# Patient Record
Sex: Male | Born: 1935 | Race: White | Hispanic: No | Smoking: Former smoker
Health system: Southern US, Community
[De-identification: ages and names within clinical notes are randomized; demographics above are authoritative.]

## PROBLEM LIST (undated history)

## (undated) DIAGNOSIS — R079 Chest pain, unspecified: Secondary | ICD-10-CM

## (undated) DIAGNOSIS — G252 Other specified forms of tremor: Secondary | ICD-10-CM

## (undated) DIAGNOSIS — F419 Anxiety disorder, unspecified: Secondary | ICD-10-CM

## (undated) DIAGNOSIS — L719 Rosacea, unspecified: Secondary | ICD-10-CM

## (undated) DIAGNOSIS — H409 Unspecified glaucoma: Secondary | ICD-10-CM

## (undated) DIAGNOSIS — Z531 Procedure and treatment not carried out because of patient's decision for reasons of belief and group pressure: Secondary | ICD-10-CM

## (undated) DIAGNOSIS — N200 Calculus of kidney: Secondary | ICD-10-CM

## (undated) DIAGNOSIS — K449 Diaphragmatic hernia without obstruction or gangrene: Secondary | ICD-10-CM

## (undated) DIAGNOSIS — E7800 Pure hypercholesterolemia, unspecified: Secondary | ICD-10-CM

## (undated) DIAGNOSIS — N400 Enlarged prostate without lower urinary tract symptoms: Secondary | ICD-10-CM

## (undated) DIAGNOSIS — R259 Unspecified abnormal involuntary movements: Secondary | ICD-10-CM

## (undated) DIAGNOSIS — H269 Unspecified cataract: Secondary | ICD-10-CM

## (undated) HISTORY — PX: FOOT SURGERY: SHX648

## (undated) HISTORY — PX: KIDNEY STONE SURGERY: SHX686

## (undated) HISTORY — PX: BACK SURGERY: SHX140

---

## 2013-04-11 HISTORY — PX: EYE SURGERY: SHX253

## 2013-04-30 ENCOUNTER — Emergency Department (HOSPITAL_COMMUNITY): Payer: Medicare Other

## 2013-04-30 ENCOUNTER — Encounter (HOSPITAL_COMMUNITY): Payer: Self-pay | Admitting: Emergency Medicine

## 2013-04-30 ENCOUNTER — Observation Stay (HOSPITAL_COMMUNITY)
Admission: EM | Admit: 2013-04-30 | Discharge: 2013-05-02 | Disposition: A | Discharge status: Home / Self Care | Payer: Medicare Other | Attending: Cardiology | Admitting: Cardiology

## 2013-04-30 DIAGNOSIS — R05 Cough: Secondary | ICD-10-CM | POA: Insufficient documentation

## 2013-04-30 DIAGNOSIS — K449 Diaphragmatic hernia without obstruction or gangrene: Secondary | ICD-10-CM | POA: Diagnosis present

## 2013-04-30 DIAGNOSIS — R0789 Other chest pain: Secondary | ICD-10-CM | POA: Insufficient documentation

## 2013-04-30 DIAGNOSIS — R059 Cough, unspecified: Secondary | ICD-10-CM | POA: Insufficient documentation

## 2013-04-30 DIAGNOSIS — E7800 Pure hypercholesterolemia, unspecified: Secondary | ICD-10-CM | POA: Insufficient documentation

## 2013-04-30 DIAGNOSIS — N400 Enlarged prostate without lower urinary tract symptoms: Secondary | ICD-10-CM | POA: Insufficient documentation

## 2013-04-30 DIAGNOSIS — R42 Dizziness and giddiness: Secondary | ICD-10-CM | POA: Insufficient documentation

## 2013-04-30 DIAGNOSIS — I249 Acute ischemic heart disease, unspecified: Secondary | ICD-10-CM

## 2013-04-30 DIAGNOSIS — H409 Unspecified glaucoma: Secondary | ICD-10-CM | POA: Insufficient documentation

## 2013-04-30 DIAGNOSIS — IMO0001 Reserved for inherently not codable concepts without codable children: Secondary | ICD-10-CM

## 2013-04-30 DIAGNOSIS — R079 Chest pain, unspecified: Secondary | ICD-10-CM

## 2013-04-30 DIAGNOSIS — R5381 Other malaise: Secondary | ICD-10-CM | POA: Insufficient documentation

## 2013-04-30 DIAGNOSIS — I200 Unstable angina: Principal | ICD-10-CM | POA: Insufficient documentation

## 2013-04-30 DIAGNOSIS — F419 Anxiety disorder, unspecified: Secondary | ICD-10-CM | POA: Diagnosis present

## 2013-04-30 DIAGNOSIS — R072 Precordial pain: Secondary | ICD-10-CM | POA: Insufficient documentation

## 2013-04-30 DIAGNOSIS — F411 Generalized anxiety disorder: Secondary | ICD-10-CM | POA: Insufficient documentation

## 2013-04-30 DIAGNOSIS — R0602 Shortness of breath: Secondary | ICD-10-CM | POA: Insufficient documentation

## 2013-04-30 DIAGNOSIS — R259 Unspecified abnormal involuntary movements: Secondary | ICD-10-CM | POA: Insufficient documentation

## 2013-04-30 DIAGNOSIS — Z87442 Personal history of urinary calculi: Secondary | ICD-10-CM | POA: Insufficient documentation

## 2013-04-30 DIAGNOSIS — R110 Nausea: Secondary | ICD-10-CM | POA: Insufficient documentation

## 2013-04-30 HISTORY — DX: Unspecified glaucoma: H40.9

## 2013-04-30 HISTORY — DX: Diaphragmatic hernia without obstruction or gangrene: K44.9

## 2013-04-30 HISTORY — DX: Chest pain, unspecified: R07.9

## 2013-04-30 HISTORY — DX: Calculus of kidney: N20.0

## 2013-04-30 HISTORY — DX: Anxiety disorder, unspecified: F41.9

## 2013-04-30 HISTORY — DX: Other specified forms of tremor: G25.2

## 2013-04-30 HISTORY — DX: Benign prostatic hyperplasia without lower urinary tract symptoms: N40.0

## 2013-04-30 HISTORY — DX: Pure hypercholesterolemia, unspecified: E78.00

## 2013-04-30 HISTORY — DX: Unspecified abnormal involuntary movements: R25.9

## 2013-04-30 LAB — URINALYSIS, ROUTINE W REFLEX MICROSCOPIC
Leukocytes, UA: NEGATIVE
Nitrite: NEGATIVE
Protein, ur: NEGATIVE mg/dL
Specific Gravity, Urine: 1.022 (ref 1.005–1.030)
Urobilinogen, UA: 0.2 mg/dL (ref 0.0–1.0)

## 2013-04-30 LAB — TROPONIN I
Troponin I: <0.3 ng/mL (ref <0.30)
Troponin I: <0.3 ng/mL (ref <0.30)
Troponin I: <0.3 ng/mL (ref <0.30)

## 2013-04-30 LAB — CBC WITH DIFFERENTIAL/PLATELET
Basophils Relative: 0 % (ref 0–1)
Eosinophils Absolute: 0.1 10*3/uL (ref 0.0–0.7)
Eosinophils Relative: 2 % (ref 0–5)
HCT: 40.3 % (ref 39.0–52.0)
Lymphs Abs: 1 10*3/uL (ref 0.7–4.0)
MCH: 30.9 pg (ref 26.0–34.0)
MCHC: 34.5 g/dL (ref 30.0–36.0)
MCV: 89.6 fL (ref 78.0–100.0)
Monocytes Relative: 12 % (ref 3–12)
Neutrophils Relative %: 68 % (ref 43–77)
Platelets: 155 10*3/uL (ref 150–400)
RBC: 4.5 MIL/uL (ref 4.22–5.81)

## 2013-04-30 LAB — COMPREHENSIVE METABOLIC PANEL
AST: 17 U/L (ref 0–37)
Albumin: 3.5 g/dL (ref 3.5–5.2)
BUN: 19 mg/dL (ref 6–23)
Calcium: 8.5 mg/dL (ref 8.4–10.5)
Chloride: 107 mEq/L (ref 96–112)
GFR calc Af Amer: 68 mL/min — ABNORMAL LOW (ref >90)
Glucose, Bld: 100 mg/dL — ABNORMAL HIGH (ref 70–99)
Sodium: 138 mEq/L (ref 135–145)
Total Protein: 6.4 g/dL (ref 6.0–8.3)

## 2013-04-30 LAB — PRO B NATRIURETIC PEPTIDE: Pro B Natriuretic peptide (BNP): 74 pg/mL (ref 0–450)

## 2013-04-30 MED ORDER — TERAZOSIN HCL 5 MG PO CAPS
10.0000 mg | ORAL_CAPSULE | Freq: Every day | ORAL | Status: DC
  Administered 2013-04-30 – 2013-05-01 (×2): 10 mg via ORAL
  Filled 2013-04-30 (×3): qty 2

## 2013-04-30 MED ORDER — TRAVOPROST (BAK FREE) 0.004 % OP SOLN
1.0000 [drp] | Freq: Every day | OPHTHALMIC | Status: DC
  Administered 2013-04-30 – 2013-05-01 (×2): 1 [drp] via OPHTHALMIC
  Filled 2013-04-30: qty 2.5

## 2013-04-30 MED ORDER — OMEGA-3-ACID ETHYL ESTERS 1 G PO CAPS
1.0000 g | ORAL_CAPSULE | Freq: Every day | ORAL | Status: DC
  Administered 2013-04-30 – 2013-05-02 (×3): 1 g via ORAL
  Filled 2013-04-30 (×3): qty 1

## 2013-04-30 MED ORDER — ACETAMINOPHEN 325 MG PO TABS
650.0000 mg | ORAL_TABLET | ORAL | Status: DC | PRN
  Administered 2013-05-01: 650 mg via ORAL
  Filled 2013-04-30 (×2): qty 2

## 2013-04-30 MED ORDER — NITROGLYCERIN 2 % TD OINT
0.5000 [in_us] | TOPICAL_OINTMENT | Freq: Four times a day (QID) | TRANSDERMAL | Status: DC
  Administered 2013-04-30 – 2013-05-01 (×3): 0.5 [in_us] via TOPICAL
  Filled 2013-04-30: qty 30

## 2013-04-30 MED ORDER — SODIUM CHLORIDE 0.9 % IV SOLN
250.0000 mL | INTRAVENOUS | Status: DC | PRN
Start: 2013-04-30 — End: 2013-05-02

## 2013-04-30 MED ORDER — REGADENOSON 0.4 MG/5ML IV SOLN
0.4000 mg | Freq: Once | INTRAVENOUS | Status: AC
  Administered 2013-05-01: 0.4 mg via INTRAVENOUS
  Filled 2013-04-30: qty 5

## 2013-04-30 MED ORDER — SODIUM CHLORIDE 0.9 % IJ SOLN: 3.0000 mL | INTRAMUSCULAR | Status: DC | PRN

## 2013-04-30 MED ORDER — ATORVASTATIN CALCIUM 40 MG PO TABS
40.0000 mg | ORAL_TABLET | Freq: Every day | ORAL | Status: DC
  Administered 2013-04-30 – 2013-05-01 (×2): 40 mg via ORAL
  Filled 2013-04-30 (×3): qty 1

## 2013-04-30 MED ORDER — PANTOPRAZOLE SODIUM 40 MG PO TBEC
40.0000 mg | DELAYED_RELEASE_TABLET | Freq: Every day | ORAL | Status: DC
  Administered 2013-05-01 – 2013-05-02 (×2): 40 mg via ORAL
  Filled 2013-04-30 (×2): qty 1

## 2013-04-30 MED ORDER — HEPARIN BOLUS VIA INFUSION
4000.0000 [IU] | Freq: Once | INTRAVENOUS | Status: AC
  Administered 2013-04-30: 4000 [IU] via INTRAVENOUS
  Filled 2013-04-30: qty 4000

## 2013-04-30 MED ORDER — ASPIRIN EC 81 MG PO TBEC
81.0000 mg | DELAYED_RELEASE_TABLET | Freq: Every day | ORAL | Status: DC
  Filled 2013-04-30 (×3): qty 1

## 2013-04-30 MED ORDER — ONDANSETRON HCL 4 MG/2ML IJ SOLN: 4.0000 mg | Freq: Four times a day (QID) | INTRAMUSCULAR | Status: DC | PRN

## 2013-04-30 MED ORDER — HEPARIN (PORCINE) IN NACL 100-0.45 UNIT/ML-% IJ SOLN
1000.0000 [IU]/h | INTRAMUSCULAR | Status: DC
  Administered 2013-04-30: 1000 [IU]/h via INTRAVENOUS
  Filled 2013-04-30: qty 250

## 2013-04-30 MED ORDER — ALPRAZOLAM 0.25 MG PO TABS
0.2500 mg | ORAL_TABLET | Freq: Two times a day (BID) | ORAL | Status: DC | PRN
  Administered 2013-04-30 – 2013-05-01 (×3): 0.25 mg via ORAL
  Filled 2013-04-30 (×2): qty 1

## 2013-04-30 MED ORDER — SODIUM CHLORIDE 0.9 % IJ SOLN
3.0000 mL | Freq: Two times a day (BID) | INTRAMUSCULAR | Status: DC
  Administered 2013-05-01 – 2013-05-02 (×2): 3 mL via INTRAVENOUS

## 2013-04-30 MED ORDER — NITROGLYCERIN 0.4 MG SL SUBL: 0.4000 mg | SUBLINGUAL_TABLET | SUBLINGUAL | Status: DC | PRN

## 2013-04-30 NOTE — H&P (Signed)
Patient ID: Troy Parker MRN: [id number], DOB/AGE: Aug 23, 1935   Admit date: 04/30/2013  Primary Physician: Almedia Balls, MD Primary Cardiologist: new to  - seen by B. Jens Som, MD   Pt. Profile:  77 y/o male w/o prior cardiac hx who presented to the ED today with intermittent chest pain.  Problem List  Past Medical History  Diagnosis Date  . Glaucoma   . Hypercholesterolemia   . BPH (benign prostatic hyperplasia)   . Nephrolithiasis     a. remote surgical removal.  . Chest pain     a. h/o nl cath in his 89's - [place].  Marland Kitchen Resting tremor     a. first noticed 03/2013 - left hand and chin/mouth.    Past Surgical History  Procedure Laterality Date  . Eye surgery  04/11/13    cataract removal R eye  . Kidney stone surgery    . Back surgery      Allergies  No Known Allergies  HPI  77 year old male with no prior history of coronary artery disease with significant family history of coronary artery disease. He was in his usual state of health until a few weeks ago when he began to experience intermittent chest discomfort but generally starts in the epigastric area and then moves up his chest where it stays for a few minutes and then resolve spontaneously. Sometimes he feels lightheaded with this discomfort. He had not been experiencing dyspnea or any radiation of discomfort. Discomfort is often improved by getting up and walking around. Yesterday, chest pressure occurred more frequently throughout the day, again lasting a few minutes prior to resolving only then to return minutes to hours later. Last night prior to going to bed, his chest pressure returned and he felt mildly dyspneic and restless. He says he also felt anxious. He was restless throughout the night and did not sleep very well and his chest pressure persisted all night long. This morning, called EMS and following their arrival he was treated with 4 sublingual nitroglycerin with eventual relief of discomfort. In the ED,  initially troponin is normal and ECG is nonacute. He is currently pain-free.  Home Medications  Prior to Admission medications   Medication Sig Start Date End Date Taking? Authorizing Provider  aspirin EC 81 MG tablet Take 81 mg by mouth daily.   Yes Historical Provider, MD  Omega-3 Fatty Acids (FISH OIL PO) Take 1 capsule by mouth daily.   Yes Historical Provider, MD  rosuvastatin (CRESTOR) 20 MG tablet Take 20 mg by mouth at bedtime.   Yes Historical Provider, MD  terazosin (HYTRIN) 10 MG capsule Take 10 mg by mouth at bedtime.   Yes Historical Provider, MD  travoprost, benzalkonium, (TRAVATAN) 0.004 % ophthalmic solution Place 1 drop into both eyes at bedtime.   Yes Historical Provider, MD   Family History  Family History  Problem Relation Age of Onset  . CAD Mother     h/o cabg and redo cabg - died in her 26's  . CAD Father     h/o cabg - died in his 87's  . CAD Brother     s/p cabg  . CAD Brother     s/p cabg  . Stroke Brother   . Other Brother     pts twin -    Social History  History   Social History  . Marital Status: Widowed    Spouse Name: N/A    Number of Children: N/A  . Years of Education: N/A  Occupational History  . Not on file.   Social History Main Topics  . Smoking status: Never Smoker   . Smokeless tobacco: Not on file  . Alcohol Use: Yes     Comment: 1 drink daily.  . Drug Use: No  . Sexual Activity: Not on file   Other Topics Concern  . Not on file   Social History Narrative   Lives in [place].  Moved here ~ 2011 from [place].  Retired Barista.    Review of Systems General:  No chills, fever, night sweats or weight changes.  Cardiovascular: postitive chest pain, no dyspnea on exertion, edema, orthopnea, palpitations, paroxysmal nocturnal dyspnea. Dermatological: No rash, lesions/masses Respiratory: No cough, dyspnea Urologic: No hematuria, dysuria Abdominal:   No nausea, vomiting, diarrhea, bright red blood per  rectum, melena, or hematemesis Neurologic:  No visual changes, wkns, changes in mental status. All other systems reviewed and are otherwise negative except as noted above.  Physical Exam  Blood pressure 124/60, pulse 59, temperature 97.7 F (36.5 C), temperature source Oral, resp. rate 12, SpO2 95.00%.  General: Pleasant, NAD Psych: Normal affect. Neuro: Alert and oriented X 3. Moves all extremities spontaneously.  Mild tremor of jaw/mouth noted. HEENT: Normal  Neck: Supple without bruits or JVD. Lungs:  Resp regular and unlabored, CTA. Heart: RRR no s3, s4, or murmurs. Abdomen: Soft, non-tender, non-distended, BS + x 4.  Extremities: No clubbing, cyanosis or edema. DP/PT/Radials 2+ and equal bilaterally.  Labs   Recent Labs  04/30/13 1041  TROPONINI <0.30   Lab Results  Component Value Date   WBC 5.5 04/30/2013   HGB 13.9 04/30/2013   HCT 40.3 04/30/2013   MCV 89.6 04/30/2013   PLT 155 04/30/2013    Recent Labs Lab 04/30/13 1041  NA 138  K 4.1  CL 107  CO2 23  BUN 19  CREATININE 1.18  CALCIUM 8.5  PROT 6.4  BILITOT 0.5  ALKPHOS 39  ALT 10  AST 17  GLUCOSE 100*   Radiology/Studies  Dg Chest 2 View  04/30/2013   CLINICAL DATA:  Two-day history of chest pain, prior history of tobacco use  EXAM: CHEST  2 VIEW  COMPARISON:  None.  FINDINGS: The lungs are hypo inflated. There are air fluid levels visible on the lateral film which on the frontal film overlie the lower mediastinum entered consistent with a partially intrathoracic stomach-hiatal hernia. There is nos evidence of an acute infiltrate. There may be minimal subsegmental atelectasis at the left lung base. There is no pleural effusion. The mediastinum is normal in width. The cardiopericardial silhouette is normal in size. The pulmonary vascularity is not engorged. There is mild tortuosity of the descending thoracic aorta.  IMPRESSION: 1. There is a large hiatal hernia-partially intrathoracic stomach. 2. There  is bilateral pulmonary hypo inflation. There may be minimal subsegmental atelectasis at the left lung base. There is no evidence of pneumonia. 3. There is no evidence of CHF nor pleural effusions.   Electronically Signed   By: David  [place]   On: 04/30/2013 12:01   ECG  Rsr, 61, no acute st/t changes.  ASSESSMENT AND PLAN  1.  [place]:  Pt presents with a 1-2 wk h/o intermittent epigastric and chest discomfort that typically lasts a few mins and resolves spontaneously.  Discomfort occurred more frequently throughout the day yesterday and all night last night.  Pain resolved with sl NTG this AM.  Here in the ED, ECG is non-acute and initial troponin is  normal (depsite prolonged duration of pain last night).  Pt has significant family history of CAD with both parents and two brothers having had CABG.  Will plan to observe and cycle cardiac enzymes.  Add heparin and nitropaste for now.  If CE remain negative consider inpt nuclear testing vs cath.  Cont asa, statin.  2.  Large Hiatal Hernia:  Noted on CXR.  ? Contribution to Ss.  If he rules out will consider inpt GI eval.  Add PPI.  3.  HL:  Cont statin.   Signed, Nicolasa Ducking, NP 04/30/2013, 1:55 PM As above, patient seen and examined. Briefly he is a 77 year old male with a past medical history of hyperlipidemia, benign prostatic hypertrophy and strong family history of coronary disease with chest pain. He typically does not have dyspnea on exertion, orthopnea, PND, pedal edema or syncope. No exertional chest pain. Over the past 2 days he has had occasional lower substernal/epigastric pain. The pain is described as an aching dull pain with no radiation. No associated nausea, diaphoresis or dyspnea. It initially lasted approximately 2 minutes and would resolve spontaneously. The pain is not pleuritic, not positional and no associated water brash. Improves with activity. He developed recurrent symptoms last evening that lasted the entire night. EMS  was called and his symptoms improved with nitroglycerin. Presently pain-free. Electrocardiogram shows sinus rhythm, RV conduction delay and no ST changes. Initial troponin normal. Plan to admit and rule out myocardial infarction with serial enzymes. If negative plan functional study tomorrow morning. Add Nexium for possible GI component. Olga Millers

## 2013-04-30 NOTE — Progress Notes (Signed)
Pt c/o not feeling well, "clammy and disoriented".  States he has a chest pressure (1/10).  BP elevated at 163/81 from previous BP (141/69).  HR remains in 60's, NSR.  Nitro paste (0.5in) applied to LT chest.  2L O2 via nasal canula applied.  Dr. Leeann Must notified.  Will continue to monitor pt.

## 2013-04-30 NOTE — ED Notes (Signed)
Called lab RE troponin.  Stated they just received blood at 1112.

## 2013-04-30 NOTE — ED Notes (Signed)
All belongings sent upstairs with patient.

## 2013-04-30 NOTE — ED Notes (Signed)
Pt undressed, in, gown on monitor, continuous pulse oximetry and blood pressure cuff

## 2013-04-30 NOTE — Progress Notes (Signed)
ANTICOAGULATION CONSULT NOTE - Initial Consult  Pharmacy Consult for heparin Indication: chest pain/ACS  No Known Allergies  Patient Measurements: Height: 5' 7.5" (171.5 cm) Weight: 193 lb 2 oz (87.6 kg) IBW/kg (Calculated) : 67.25 Heparin Dosing Weight: 84 kg   Vital Signs: Temp: 97.1 F (36.2 C) (12/27 1532) Temp src: Oral (12/27 1532) BP: 141/69 mmHg (12/27 1532) Pulse Rate: 60 (12/27 1532)  Labs:  Recent Labs  04/30/13 1041 04/30/13 1435  HGB 13.9  --   HCT 40.3  --   PLT 155  --   CREATININE 1.18  --   TROPONINI <0.30 <0.30    Estimated Creatinine Clearance: 57.7 ml/min (by C-G formula based on Cr of 1.18).   Medical History: Past Medical History  Diagnosis Date  . Glaucoma   . Hypercholesterolemia   . BPH (benign prostatic hyperplasia)   . Nephrolithiasis     a. remote surgical removal.  . Chest pain     a. h/o nl cath in his 19's - [place].  Marland Kitchen Resting tremor     a. first noticed 03/2013 - left hand and chin/mouth.    Medications:  Prescriptions prior to admission  Medication Sig Dispense Refill  . aspirin EC 81 MG tablet Take 81 mg by mouth daily.      . Omega-3 Fatty Acids (FISH OIL PO) Take 1 capsule by mouth daily.      . rosuvastatin (CRESTOR) 20 MG tablet Take 20 mg by mouth at bedtime.      Marland Kitchen terazosin (HYTRIN) 10 MG capsule Take 10 mg by mouth at bedtime.      . travoprost, benzalkonium, (TRAVATAN) 0.004 % ophthalmic solution Place 1 drop into both eyes at bedtime.        Assessment: 77 YOM w/o prior cardiac hx presented to ED with CP. In the ED, initial troponin was normal and EGC nonacute. Starting heparin for [place] / CP. CBC stable.    Goal of Therapy:  Heparin level 0.3-0.7 units/ml Monitor platelets by anticoagulation protocol: Yes   Plan:  1) Heparin 4000 units bolus x 1  2) Start heparin infusion at 1000 units/hr  3) F/u CBC, 6 hour HL and s/s of bleeding   Vinnie Level, PharmD.  Clinical Pharmacist Pager  [phone]

## 2013-04-30 NOTE — ED Notes (Signed)
Pt provided water to aid in his providing urine sample.

## 2013-04-30 NOTE — ED Notes (Addendum)
Pt c/o intermittent chest pain and sob x 1 week.  Productive cough yesterday, but otherwise afebrile.  Pain is tight and central to chest, non-radiating.  Pt given 4 0.4mg  sl with relief (pain now 1/10).  PT states he feels anxious.  Pt took 4 81 mg asa before ems arrived.

## 2013-04-30 NOTE — ED Notes (Signed)
Dr. Rancour at bedside. 

## 2013-04-30 NOTE — ED Provider Notes (Signed)
CSN: [id number]     Arrival date & time 04/30/13  1011 History   First MD Initiated Contact with Patient 04/30/13 1018     Chief Complaint  Patient presents with  . Chest Pain  . Shortness of Breath   (Consider location/radiation/quality/duration/timing/severity/associated sxs/prior Treatment) HPI Comments: Patient presents via EMS with 3 day history of intermittent central chest pressure associated with shortness of breath and nausea. The pain comes and goes lasting for several minutes to hours at a time. He was up all my because of the pain. It is not radiate. It is central in his chest and relieved now with nitroglycerin. He denies any cardiac history. Reports a stress test 35 years ago. He endorses the pain gets worse with exertion and he has some shortness of breath when he climbs the stairs. Denies any leg pain or leg swelling. Denies any other medical problems.  The history is provided by the patient.    Past Medical History  Diagnosis Date  . Glaucoma   . Hypercholesterolemia   . BPH (benign prostatic hyperplasia)   . Nephrolithiasis     a. remote surgical removal.  . Chest pain     a. h/o nl cath in his 39's - [place].  Marland Kitchen Resting tremor     a. first noticed 03/2013 - left hand and chin/mouth.   Past Surgical History  Procedure Laterality Date  . Eye surgery  04/11/13    cataract removal R eye  . Kidney stone surgery    . Back surgery     Family History  Problem Relation Age of Onset  . CAD Mother     h/o cabg and redo cabg - died in her 25's  . CAD Father     h/o cabg - died in his 91's  . CAD Brother     s/p cabg  . CAD Brother     s/p cabg  . Stroke Brother   . Other Brother     pts twin -    History  Substance Use Topics  . Smoking status: Never Smoker   . Smokeless tobacco: Not on file  . Alcohol Use: Yes     Comment: 1 drink daily.    Review of Systems  Constitutional: Positive for activity change, appetite change and fatigue. Negative for fever.   Respiratory: Positive for cough, chest tightness and shortness of breath.   Cardiovascular: Positive for chest pain.  Gastrointestinal: Negative for nausea and abdominal pain.  Genitourinary: Negative for dysuria and hematuria.  Musculoskeletal: Negative for arthralgias, back pain and myalgias.  Skin: Negative for rash.  Neurological: Positive for dizziness and light-headedness. Negative for weakness and headaches.  A complete 10 system review of systems was obtained and all systems are negative except as noted in the HPI and PMH.    Allergies  Review of patient's allergies indicates no known allergies.  Home Medications  No current outpatient prescriptions on file. BP 141/69  Pulse 60  Temp(Src) 97.1 F (36.2 C) (Oral)  Resp 14  Ht 5' 7.5" (1.715 m)  Wt 193 lb 2 oz (87.6 kg)  BMI 29.78 kg/m2  SpO2 96% Physical Exam  Constitutional: He is oriented to person, place, and time. He appears well-developed and well-nourished. No distress.  Appears anxious, lower lip tremor  HENT:  Head: Normocephalic and atraumatic.  Mouth/Throat: Oropharynx is clear and moist. No oropharyngeal exudate.  Eyes: Conjunctivae and EOM are normal. Pupils are equal, round, and reactive to light.  Neck: Normal  range of motion. Neck supple.  Cardiovascular: Normal rate, regular rhythm, normal heart sounds and intact distal pulses.   Pulmonary/Chest: Effort normal and breath sounds normal. No respiratory distress.  Abdominal: Soft. There is no tenderness. There is no rebound and no guarding.  Musculoskeletal: Normal range of motion. He exhibits no edema and no tenderness.  Neurological: He is alert and oriented to person, place, and time. No cranial nerve deficit. He exhibits normal muscle tone. Coordination normal.  Skin: Skin is warm.    ED Course  Procedures (including critical care time) Labs Review Labs Reviewed  COMPREHENSIVE METABOLIC PANEL - Abnormal; Notable for the following:    Glucose, Bld  100 (*)    GFR calc non Af Amer 59 (*)    GFR calc Af Amer 68 (*)    All other components within normal limits  URINALYSIS, ROUTINE W REFLEX MICROSCOPIC - Abnormal; Notable for the following:    Ketones, ur 15 (*)    All other components within normal limits  CBC WITH DIFFERENTIAL  TROPONIN I  PRO B NATRIURETIC PEPTIDE  TROPONIN I  TROPONIN I  TROPONIN I  TSH  HEPARIN LEVEL (UNFRACTIONATED)  LIPID PANEL  CBC   Imaging Review Dg Chest 2 View  04/30/2013   CLINICAL DATA:  Two-day history of chest pain, prior history of tobacco use  EXAM: CHEST  2 VIEW  COMPARISON:  None.  FINDINGS: The lungs are hypo inflated. There are air fluid levels visible on the lateral film which on the frontal film overlie the lower mediastinum entered consistent with a partially intrathoracic stomach-hiatal hernia. There is nos evidence of an acute infiltrate. There may be minimal subsegmental atelectasis at the left lung base. There is no pleural effusion. The mediastinum is normal in width. The cardiopericardial silhouette is normal in size. The pulmonary vascularity is not engorged. There is mild tortuosity of the descending thoracic aorta.  IMPRESSION: 1. There is a large hiatal hernia-partially intrathoracic stomach. 2. There is bilateral pulmonary hypo inflation. There may be minimal subsegmental atelectasis at the left lung base. There is no evidence of pneumonia. 3. There is no evidence of CHF nor pleural effusions.   Electronically Signed   By: David  [place]   On: 04/30/2013 12:01    EKG Interpretation    Date/Time:  Saturday April 30 2013 10:26:41 EST Ventricular Rate:  61 PR Interval:  174 QRS Duration: 112 QT Interval:  400 QTC Calculation: 403 R Axis:   43 Text Interpretation:  Sinus rhythm Incomplete right bundle branch block Borderline ST elevation, anterior leads No previous ECGs available Confirmed by Delshon Blanchfield  MD, Ismelda Weatherman ([id number]) on 04/30/2013 10:41:44 AM            MDM   1.  Acute coronary syndrome    3 day history of chest pain it is central and worse on exertion or shortness of breath. No chest pain at this time after nitroglycerin.  History concerning for angina. Patient already received ASA. EKG without acute ST changes. Troponin negative.  Heparin will be started for presumed ACS. D/ wCardiology who will admit. Doubt PE or dissection as now pain at this time. No SOB.  No neuro deficits and equal peripheral pulses.    Glynn Octave, MD 04/30/13 [phone]

## 2013-05-01 ENCOUNTER — Observation Stay (HOSPITAL_COMMUNITY): Payer: Medicare Other

## 2013-05-01 ENCOUNTER — Encounter (HOSPITAL_COMMUNITY): Payer: Self-pay | Admitting: Nurse Practitioner

## 2013-05-01 DIAGNOSIS — H409 Unspecified glaucoma: Secondary | ICD-10-CM | POA: Diagnosis present

## 2013-05-01 DIAGNOSIS — G252 Other specified forms of tremor: Secondary | ICD-10-CM | POA: Diagnosis present

## 2013-05-01 DIAGNOSIS — I200 Unstable angina: Secondary | ICD-10-CM

## 2013-05-01 DIAGNOSIS — R079 Chest pain, unspecified: Secondary | ICD-10-CM

## 2013-05-01 DIAGNOSIS — Z136 Encounter for screening for cardiovascular disorders: Secondary | ICD-10-CM

## 2013-05-01 DIAGNOSIS — N400 Enlarged prostate without lower urinary tract symptoms: Secondary | ICD-10-CM | POA: Diagnosis present

## 2013-05-01 DIAGNOSIS — E7800 Pure hypercholesterolemia, unspecified: Secondary | ICD-10-CM | POA: Diagnosis present

## 2013-05-01 LAB — CBC
HCT: 40.9 % (ref 39.0–52.0)
MCH: 30.2 pg (ref 26.0–34.0)
Platelets: 161 10*3/uL (ref 150–400)
RBC: 4.54 MIL/uL (ref 4.22–5.81)
RDW: 12.9 % (ref 11.5–15.5)
WBC: 6.8 10*3/uL (ref 4.0–10.5)

## 2013-05-01 LAB — LIPID PANEL
Cholesterol: 127 mg/dL (ref 0–200)
HDL: 59 mg/dL (ref >39)
LDL Cholesterol: 55 mg/dL (ref 0–99)
Total CHOL/HDL Ratio: 2.2 RATIO
VLDL: 13 mg/dL (ref 0–40)

## 2013-05-01 LAB — HEPARIN LEVEL (UNFRACTIONATED): Heparin Unfractionated: 0.18 IU/mL — ABNORMAL LOW (ref 0.30–0.70)

## 2013-05-01 MED ORDER — ALPRAZOLAM 0.25 MG PO TABS
ORAL_TABLET | ORAL | Status: AC
  Filled 2013-05-01: qty 1

## 2013-05-01 MED ORDER — TECHNETIUM TC 99M SESTAMIBI - CARDIOLITE
30.0000 | Freq: Once | INTRAVENOUS | Status: AC | PRN
  Administered 2013-05-01: 10:00:00 30 via INTRAVENOUS

## 2013-05-01 MED ORDER — TECHNETIUM TC 99M SESTAMIBI GENERIC - CARDIOLITE
10.0000 | Freq: Once | INTRAVENOUS | Status: AC | PRN
  Administered 2013-05-01: 10 via INTRAVENOUS

## 2013-05-01 MED ORDER — HEPARIN (PORCINE) IN NACL 100-0.45 UNIT/ML-% IJ SOLN
1200.0000 [IU]/h | INTRAMUSCULAR | Status: DC
  Filled 2013-05-01: qty 250

## 2013-05-01 MED ORDER — REGADENOSON 0.4 MG/5ML IV SOLN
INTRAVENOUS | Status: AC
  Filled 2013-05-01: qty 5

## 2013-05-01 NOTE — Progress Notes (Signed)
ANTICOAGULATION CONSULT NOTE - Follow-Up Consult  Pharmacy Consult for heparin Indication: chest pain/ACS  No Known Allergies  Patient Measurements: Height: 5' 7.5" (171.5 cm) Weight: 193 lb 2 oz (87.6 kg) IBW/kg (Calculated) : 67.25 Heparin Dosing Weight: 84 kg   Vital Signs: Temp: 97.8 F (36.6 C) (12/28 0529) Temp src: Oral (12/28 0529) BP: 117/52 mmHg (12/28 0529) Pulse Rate: 59 (12/28 0529)  Labs:  Recent Labs  04/30/13 1041 04/30/13 1435 04/30/13 2050 04/30/13 2317 05/01/13 0305  HGB 13.9  --   --   --  13.7  HCT 40.3  --   --   --  40.9  PLT 155  --   --   --  161  HEPARINUNFRC  --   --   --  0.36 0.18*  CREATININE 1.18  --   --   --   --   TROPONINI <0.30 <0.30 <0.30  --  <0.30    Estimated Creatinine Clearance: 57.7 ml/min (by C-G formula based on Cr of 1.18).   Medical History: Past Medical History  Diagnosis Date  . Glaucoma   . Hypercholesterolemia   . BPH (benign prostatic hyperplasia)   . Nephrolithiasis     a. remote surgical removal.  . Chest pain     a. h/o nl cath in his 46's - [place].  Marland Kitchen Resting tremor     a. first noticed 03/2013 - left hand and chin/mouth.    Medications:  Prescriptions prior to admission  Medication Sig Dispense Refill  . aspirin EC 81 MG tablet Take 81 mg by mouth daily.      . Omega-3 Fatty Acids (FISH OIL PO) Take 1 capsule by mouth daily.      . rosuvastatin (CRESTOR) 20 MG tablet Take 20 mg by mouth at bedtime.      Marland Kitchen terazosin (HYTRIN) 10 MG capsule Take 10 mg by mouth at bedtime.      . travoprost, benzalkonium, (TRAVATAN) 0.004 % ophthalmic solution Place 1 drop into both eyes at bedtime.       . heparin       Assessment: 77 YOM w/o prior cardiac hx presented to ED with CP. In the ED, initial troponin was normal and EGC nonacute. Starting heparin for [place] / CP. CBC stable.   Initial heparin level at goal on 1000 units/hr.  Repeat heparin level now subtherapeutic.  Per RN, no issues with IV overnight.  No  bleeding or complications noted per chart notes.  Goal of Therapy:  Heparin level 0.3-0.7 units/ml Monitor platelets by anticoagulation protocol: Yes   Plan:  1) Increase IV heparin to 1200 units/hr. 2) Check heparin level in 6 hrs. 3) Continue daily heparin level and CBC.  Tad Moore, BCPS  Clinical Pharmacist Pager [phone]  05/01/2013 5:37 AM

## 2013-05-01 NOTE — Progress Notes (Signed)
Patient Name: Troy Parker Date of Encounter: 05/01/2013   Principal Problem:   Unstable angina Active Problems:   Hiatal hernia   Resting tremor   BPH (benign prostatic hyperplasia)   Hypercholesterolemia   Glaucoma   SUBJECTIVE  He says that he's been fairly anxious since being admitted.  He's continued to have brief episodes of epigastric and chest pressure.  Despite prolonged episode leading to admission, troponin has remained normal.  He's scheduled for lexiscan cardiolite this morning.  CURRENT MEDS . aspirin EC  81 mg Oral Daily  . atorvastatin  40 mg Oral q1800  . omega-3 acid ethyl esters  1 g Oral Daily  . pantoprazole  40 mg Oral Q0600  . regadenoson  0.4 mg Intravenous Once  . sodium chloride  3 mL Intravenous Q12H  . terazosin  10 mg Oral QHS  . Travoprost (BAK Free)  1 drop Both Eyes QHS    OBJECTIVE  Filed Vitals:   04/30/13 1944 04/30/13 2051 05/01/13 0015 05/01/13 0529  BP: 159/73 148/65 124/56 117/52  Pulse: 67 61 63 59  Temp:  98 F (36.7 C)  97.8 F (36.6 C)  TempSrc:  Oral  Oral  Resp: 14 18  18   Height:      Weight:      SpO2: 97% 98%  96%    Intake/Output Summary (Last 24 hours) at 05/01/13 0816 Last data filed at 05/01/13 8469  Gross per 24 hour  Intake 240.33 ml  Output    225 ml  Net  15.33 ml   Filed Weights   04/30/13 1519  Weight: 193 lb 2 oz (87.6 kg)    PHYSICAL EXAM  General: Pleasant, NAD. Neuro: Alert and oriented X 3. Moves all extremities spontaneously. Psych: Normal affect. HEENT:  Normal  Neck: Supple without bruits or JVD. Lungs:  Resp regular and unlabored, CTA. Heart: RRR no s3, s4, or murmurs. Abdomen: Soft, non-tender, non-distended, BS + x 4.  Extremities: No clubbing, cyanosis or edema. DP/PT/Radials 2+ and equal bilaterally.  Accessory Clinical Findings  CBC  Recent Labs  04/30/13 1041 05/01/13 0305  WBC 5.5 6.8  NEUTROABS 3.8  --   HGB 13.9 13.7  HCT 40.3 40.9  MCV 89.6 90.1  PLT 155  161   Basic Metabolic Panel  Recent Labs  04/30/13 1041  NA 138  K 4.1  CL 107  CO2 23  GLUCOSE 100*  BUN 19  CREATININE 1.18  CALCIUM 8.5   Liver Function Tests  Recent Labs  04/30/13 1041  AST 17  ALT 10  ALKPHOS 39  BILITOT 0.5  PROT 6.4  ALBUMIN 3.5   Cardiac Enzymes  Recent Labs  04/30/13 1435 04/30/13 2050 05/01/13 0305  TROPONINI <0.30 <0.30 <0.30   Fasting Lipid Panel  Recent Labs  05/01/13 0305  CHOL 127  HDL 59  LDLCALC 55  TRIG 66  CHOLHDL 2.2   TELE  rsr  ECG  Sb, 59, no acute st/t changes.  Radiology/Studies  Dg Chest 2 View  04/30/2013   CLINICAL DATA:  Two-day history of chest pain, prior history of tobacco use  EXAM: CHEST  2 VIEW  COMPARISON:  None.  FINDINGS: The lungs are hypo inflated. There are air fluid levels visible on the lateral film which on the frontal film overlie the lower mediastinum entered consistent with a partially intrathoracic stomach-hiatal hernia. There is nos evidence of an acute infiltrate. There may be minimal subsegmental atelectasis at the left lung base.  There is no pleural effusion. The mediastinum is normal in width. The cardiopericardial silhouette is normal in size. The pulmonary vascularity is not engorged. There is mild tortuosity of the descending thoracic aorta.  IMPRESSION: 1. There is a large hiatal hernia-partially intrathoracic stomach. 2. There is bilateral pulmonary hypo inflation. There may be minimal subsegmental atelectasis at the left lung base. There is no evidence of pneumonia. 3. There is no evidence of CHF nor pleural effusions.   Electronically Signed   By: David  [place]   On: 04/30/2013 12:01   ASSESSMENT AND PLAN  1.  Chest pain:  Pt with a 1-2 wk h/o intermittent chest and epigastric pain that worsened on the evening prior to admission.  Despite prolonged Ss, CE negative.  He has continued to have intermittent chest and epigastric pressure.  D/c heparin/ntp.  Cardiolite this AM.   Large hiatal hernia noted on cxr.  If CL neg, will ask GI to see.  2.  Large hiatal hernia:  GI consult.  3.  HL:  Cont statin.  Signed, Nicolasa Ducking NP

## 2013-05-01 NOTE — Progress Notes (Signed)
ANTICOAGULATION CONSULT NOTE - Follow-Up Consult  Pharmacy Consult for heparin Indication: chest pain/ACS  No Known Allergies  Patient Measurements: Height: 5' 7.5" (171.5 cm) Weight: 193 lb 2 oz (87.6 kg) IBW/kg (Calculated) : 67.25 Heparin Dosing Weight: 84 kg   Vital Signs: Temp: 98 F (36.7 C) (12/27 2051) Temp src: Oral (12/27 2051) BP: 148/65 mmHg (12/27 2051) Pulse Rate: 61 (12/27 2051)  Labs:  Recent Labs  04/30/13 1041 04/30/13 1435 04/30/13 2050 04/30/13 2317  HGB 13.9  --   --   --   HCT 40.3  --   --   --   PLT 155  --   --   --   HEPARINUNFRC  --   --   --  0.36  CREATININE 1.18  --   --   --   TROPONINI <0.30 <0.30 <0.30  --     Estimated Creatinine Clearance: 57.7 ml/min (by C-G formula based on Cr of 1.18).   Medical History: Past Medical History  Diagnosis Date  . Glaucoma   . Hypercholesterolemia   . BPH (benign prostatic hyperplasia)   . Nephrolithiasis     a. remote surgical removal.  . Chest pain     a. h/o nl cath in his 66's - [place].  Marland Kitchen Resting tremor     a. first noticed 03/2013 - left hand and chin/mouth.    Medications:  Prescriptions prior to admission  Medication Sig Dispense Refill  . aspirin EC 81 MG tablet Take 81 mg by mouth daily.      . Omega-3 Fatty Acids (FISH OIL PO) Take 1 capsule by mouth daily.      . rosuvastatin (CRESTOR) 20 MG tablet Take 20 mg by mouth at bedtime.      Marland Kitchen terazosin (HYTRIN) 10 MG capsule Take 10 mg by mouth at bedtime.      . travoprost, benzalkonium, (TRAVATAN) 0.004 % ophthalmic solution Place 1 drop into both eyes at bedtime.       . heparin 1,000 Units/hr (04/30/13 1658)     Assessment: 77 YOM w/o prior cardiac hx presented to ED with CP. In the ED, initial troponin was normal and EGC nonacute. Starting heparin for [place] / CP. CBC stable.   Initial heparin level at goal on 1000 units/hr.  No bleeding or complications noted per chart notes.  Goal of Therapy:  Heparin level 0.3-0.7  units/ml Monitor platelets by anticoagulation protocol: Yes   Plan:  1) Continue IV heparin at current rate. 2) Will confirm heparin level with AM labs. 3) Continue daily heparin level and CBC.  Tad Moore, BCPS  Clinical Pharmacist Pager [phone]  05/01/2013 12:04 AM

## 2013-05-01 NOTE — Progress Notes (Signed)
The patient was seen and examined, and I agree with the assessment and plan as documented above, with modifications as noted below. Nuclear myocardial perfusion study is normal, LVEF 70%. GI has evaluated (appreciate their input) and also deems chest pain to be GI-related, and likely due to large hiatal hernia. They have planned for a GI series.

## 2013-05-01 NOTE — Consult Note (Signed)
GI Consultation  Referring Provider: No ref. provider found Primary Care Physician:  Almedia Balls, MD Primary Gastroenterologist:    Reason for Consultation:  **Chest pain*  HPI: Troy Parker is a 77 y.o. male *referred for evaluation of chest pain.  He was admitted yesterday*for a several week history of brief, intermittent upper epigastric and chest discomfort.  Pain is described as pressure.  It may occur postprandially.  It may last just minutes or up to an hour and is a moderate intensity.  It is not exercise-related.  He generally walks 3 miles a day without problems.  He rarely gets pyrosis.  He denies dysphagia.  He is on no regular gastric irritants including nonsteroidals.  Chest x-ray demonstrated an intrathoracic stomach as evidenced by air fluid levels.  Cardiac enzymes are negative and EKG does not demonstrate acute changes..  Patient is scheduled for*Cardiolite exam this a.m.   Past Medical History  Diagnosis Date  . Glaucoma   . Hypercholesterolemia   . BPH (benign prostatic hyperplasia)   . Nephrolithiasis     a. remote surgical removal.  . Chest pain     a. h/o nl cath in his 63's - [place].  Marland Kitchen Resting tremor     a. first noticed 03/2013 - left hand and chin/mouth.  . Hiatal hernia     a. noted on cxr 04/2013.    Past Surgical History  Procedure Laterality Date  . Eye surgery  04/11/13    cataract removal R eye  . Kidney stone surgery    . Back surgery      Prior to Admission medications   Medication Sig Start Date End Date Taking? Authorizing Provider  aspirin EC 81 MG tablet Take 81 mg by mouth daily.   Yes Historical Provider, MD  Omega-3 Fatty Acids (FISH OIL PO) Take 1 capsule by mouth daily.   Yes Historical Provider, MD  rosuvastatin (CRESTOR) 20 MG tablet Take 20 mg by mouth at bedtime.   Yes Historical Provider, MD  terazosin (HYTRIN) 10 MG capsule Take 10 mg by mouth at bedtime.   Yes Historical Provider, MD  travoprost, benzalkonium,  (TRAVATAN) 0.004 % ophthalmic solution Place 1 drop into both eyes at bedtime.   Yes Historical Provider, MD    Current Facility-Administered Medications  Medication Dose Route Frequency Provider Last Rate Last Dose  . 0.9 %  sodium chloride infusion  250 mL Intravenous PRN Ok Anis, NP      . acetaminophen (TYLENOL) tablet 650 mg  650 mg Oral Q4H PRN Ok Anis, NP      . ALPRAZolam Prudy Feeler) tablet 0.25 mg  0.25 mg Oral Q12H PRN Leeann Must, MD   0.25 mg at 05/01/13 0906  . aspirin EC tablet 81 mg  81 mg Oral Daily Ok Anis, NP      . atorvastatin (LIPITOR) tablet 40 mg  40 mg Oral q1800 Ok Anis, NP   40 mg at 04/30/13 2204  . nitroGLYCERIN (NITROSTAT) SL tablet 0.4 mg  0.4 mg Sublingual Q5 Min x 3 PRN Ok Anis, NP      . omega-3 acid ethyl esters (LOVAZA) capsule 1 g  1 g Oral Daily Ok Anis, NP   1 g at 04/30/13 2204  . ondansetron (ZOFRAN) injection 4 mg  4 mg Intravenous Q6H PRN Ok Anis, NP      . pantoprazole (PROTONIX) EC tablet 40 mg  40 mg  Oral Q0600 Ok Anis, NP   40 mg at 05/01/13 1610  . sodium chloride 0.9 % injection 3 mL  3 mL Intravenous Q12H Ok Anis, NP      . sodium chloride 0.9 % injection 3 mL  3 mL Intravenous PRN Ok Anis, NP      . terazosin (HYTRIN) capsule 10 mg  10 mg Oral QHS Ok Anis, NP   10 mg at 04/30/13 2204  . Travoprost (BAK Free) (TRAVATAN) 0.004 % ophthalmic solution SOLN 1 drop  1 drop Both Eyes QHS Ok Anis, NP   1 drop at 04/30/13 2202    Allergies as of 04/30/2013  . (No Known Allergies)    Family History  Problem Relation Age of Onset  . CAD Mother     h/o cabg and redo cabg - died in her 20's  . CAD Father     h/o cabg - died in his 59's  . CAD Brother     s/p cabg  . CAD Brother     s/p cabg  . Stroke Brother   . Other Brother     pts twin -     History   Social History  . Marital Status: Widowed     Spouse Name: N/A    Number of Children: N/A  . Years of Education: N/A   Occupational History  . Not on file.   Social History Main Topics  . Smoking status: Never Smoker   . Smokeless tobacco: Not on file  . Alcohol Use: Yes     Comment: 1 drink daily.  . Drug Use: No  . Sexual Activity: Not on file   Other Topics Concern  . Not on file   Social History Narrative   Lives in [place].  Moved here ~ 2011 from [place].  Retired Barista.    Review of Systems: Pertinent positive and negative review of systems were noted in the above history of present illness section. All other review of systems were otherwise negative   Physical Exam: Vital signs in last 24 hours: Temp:  [97.1 F (36.2 C)-98.1 F (36.7 C)] 97.8 F (36.6 C) (12/28 0529) Pulse Rate:  [55-105] 81 (12/28 0945) Resp:  [11-18] 18 (12/28 0529) BP: (117-167)/(43-82) 136/82 mmHg (12/28 0945) SpO2:  [95 %-98 %] 96 % (12/28 0529) Weight:  [193 lb 2 oz (87.6 kg)] 193 lb 2 oz (87.6 kg) (12/27 1519)  General:   Alert,  Well-developed, well-nourished, pleasant and cooperative in NAD Head:  Normocephalic and atraumatic. Eyes:  Sclera clear, no icterus.   Conjunctiva pink. Ears:  Normal auditory acuity. Nose:  No deformity, discharge,  or lesions. Mouth:  No deformity or lesions.  Oropharynx pink & moist. Neck:  Supple; no masses or thyromegaly. Lungs:  Clear throughout to auscultation.   No wheezes, crackles, or rhonchi. No acute distress. Heart:  Regular rate and rhythm; no murmurs, clicks, rubs,  or gallops. Abdomen:  Soft, nontender and nondistended. No masses, hepatosplenomegaly or hernias noted. Normal bowel sounds, without guarding, and without rebound.   Rectal:  Deferred until time of colonoscopy.   Msk:  Symmetrical without gross deformities. Normal posture. Pulses:  Normal pulses noted. Extremities:  Without clubbing or edema. Neurologic:  Alert and  oriented x4;  grossly normal  neurologically. Skin:  Intact without significant lesions or rashes. Cervical Nodes:  No significant cervical adenopathy. Psych:  Alert and cooperative. Normal mood and affect.  Intake/Output from previous day:  12/27 0701 - 12/28 0700 In: 240.3 [P.O.:120; I.V.:120.3] Out: 225 [Urine:225] Intake/Output this shift:    Lab Results:  Recent Labs  04/30/13 1041 05/01/13 0305  WBC 5.5 6.8  HGB 13.9 13.7  HCT 40.3 40.9  PLT 155 161   BMET  Recent Labs  04/30/13 1041  NA 138  K 4.1  CL 107  CO2 23  GLUCOSE 100*  BUN 19  CREATININE 1.18  CALCIUM 8.5   LFT  Recent Labs  04/30/13 1041  PROT 6.4  ALBUMIN 3.5  AST 17  ALT 10  ALKPHOS 39  BILITOT 0.5   PT/INR No results found for this basename: LABPROT, INR,  in the last 72 hours Hepatitis Panel No results found for this basename: HEPBSAG, HCVAB, HEPAIGM, HEPBIGM,  in the last 72 hours Additional Labs **  Studies/Results: Dg Chest 2 View  04/30/2013   CLINICAL DATA:  Two-day history of chest pain, prior history of tobacco use  EXAM: CHEST  2 VIEW  COMPARISON:  None.  FINDINGS: The lungs are hypo inflated. There are air fluid levels visible on the lateral film which on the frontal film overlie the lower mediastinum entered consistent with a partially intrathoracic stomach-hiatal hernia. There is nos evidence of an acute infiltrate. There may be minimal subsegmental atelectasis at the left lung base. There is no pleural effusion. The mediastinum is normal in width. The cardiopericardial silhouette is normal in size. The pulmonary vascularity is not engorged. There is mild tortuosity of the descending thoracic aorta.  IMPRESSION: 1. There is a large hiatal hernia-partially intrathoracic stomach. 2. There is bilateral pulmonary hypo inflation. There may be minimal subsegmental atelectasis at the left lung base. There is no evidence of pneumonia. 3. There is no evidence of CHF nor pleural effusions.   Electronically Signed    By: David  [place]   On: 04/30/2013 12:01   Nm Myocar Multi W/spect W/wall Motion / Ef  05/01/2013   CLINICAL DATA:  Unstable angina, history hypercholesterolemia  EXAM: MYOCARDIAL IMAGING WITH SPECT (REST AND PHARMACOLOGIC-STRESS)  GATED LEFT VENTRICULAR WALL MOTION STUDY  LEFT VENTRICULAR EJECTION FRACTION  TECHNIQUE: Standard myocardial SPECT imaging was performed after resting intravenous injection of 10 mCi Tc-2m sestamibi. Subsequently, intravenous infusion of Lexiscan was performed under the supervision of the Cardiology staff. At peak effect of the drug, 30 mCi Tc-69m sestamibi was injected intravenously and standard myocardial SPECT imaging was performed. Quantitative gated imaging was also performed to evaluate left ventricular wall motion, and estimate left ventricular ejection fraction.  COMPARISON:  None.  FINDINGS: Myocardial profusion SPECT images obtained following pharmacolocic stress are normal.  Resting exam unchanged.  No pulmonary PICC with tracer.  Minimally elevated left ventricular ejection fraction is 70% is calculated on gated SPECT images following exercise.  This is derived from an end-diastolic volume calculation of 81 mL and end systolic volume of 18 mL.  Normal wall motion analysis on review of gated SPECT images following exercise.  IMPRESSION: Normal exam.   Electronically Signed   By: Ulyses Southward M.D.   On: 05/01/2013 11:55    Principal Problem:   Unstable angina Active Problems:   Resting tremor   BPH (benign prostatic hyperplasia)   Hypercholesterolemia   Glaucoma   Hiatal hernia   Recommendations  1.  upper GI series 2.  did you PPI therapy**  Assessment - *chest pain of non-cardiac etiology.  I suspect that pain is GI-related.  It very well may be related to his large hiatal  hernia.  He appears to have a large intrathoracic portion of the stomach.  This may by itself cause  symptoms.  Intermittent gastric volvulus is also a concern.       Barbette Hair.  Arlyce Dice, MD, [facility] [phone]    05/01/2013, 12:14 PM

## 2013-05-01 NOTE — Progress Notes (Signed)
Utilization Review completed.  

## 2013-05-02 ENCOUNTER — Encounter (HOSPITAL_COMMUNITY): Payer: Self-pay | Admitting: General Surgery

## 2013-05-02 ENCOUNTER — Observation Stay (HOSPITAL_COMMUNITY): Payer: Medicare Other

## 2013-05-02 DIAGNOSIS — K449 Diaphragmatic hernia without obstruction or gangrene: Secondary | ICD-10-CM

## 2013-05-02 DIAGNOSIS — F419 Anxiety disorder, unspecified: Secondary | ICD-10-CM | POA: Diagnosis present

## 2013-05-02 LAB — CBC
HCT: 40.8 % (ref 39.0–52.0)
MCHC: 33.8 g/dL (ref 30.0–36.0)
MCV: 89.9 fL (ref 78.0–100.0)
RDW: 12.9 % (ref 11.5–15.5)

## 2013-05-02 MED ORDER — PANTOPRAZOLE SODIUM 40 MG PO TBEC: 40.0000 mg | DELAYED_RELEASE_TABLET | Freq: Every day | ORAL | Status: DC

## 2013-05-02 MED ORDER — ALPRAZOLAM 0.25 MG PO TABS: 0.2500 mg | ORAL_TABLET | Freq: Two times a day (BID) | ORAL | Status: DC | PRN

## 2013-05-02 NOTE — Progress Notes (Signed)
Daily Rounding Note  05/02/2013, 9:49 AM  LOS: 2 days    SUBJECTIVE:    After eating last night developed recurrent pressure in region of lower sternum.  When he gets the pain, he becomes anxious and had recently been using anxiolytics for this. Does not describe a lot of dysphagia.   Had screening colonoscopy in [place] several years ago but never had an EGD and no regular use of PPI, antacids etc.      OBJECTIVE:         Vital signs in last 24 hours:    Temp:  [97.6 F (36.4 C)-98.5 F (36.9 C)] 98.5 F (36.9 C) (12/29 0545) Pulse Rate:  [61-69] 69 (12/29 0545) Resp:  [15-16] 15 (12/29 0545) BP: (117-155)/(62-64) 117/62 mmHg (12/29 0545) SpO2:  [94 %-97 %] 94 % (12/29 0545) Weight:  [89.1 kg (196 lb 6.9 oz)] 89.1 kg (196 lb 6.9 oz) (12/28 2100) Last BM Date: 05/01/13 General: looks well, comfortable Heart: RRR Chest: clear bil.  No dyspnea or cough Abdomen: soft, NT, ND.  No mass or HSM.  BS active  Extremities: no CCE Neuro/Psych:  Pleasant, relaxed, fully alert  Intake/Output from previous day: 12/28 0701 - 12/29 0700 In: 480 [P.O.:480] Out: -   Intake/Output this shift:    Lab Results:  Recent Labs  2013-05-06 1041 05/01/13 0305 05/02/13 0445  WBC 5.5 6.8 6.0  HGB 13.9 13.7 13.8  HCT 40.3 40.9 40.8  PLT 155 161 161   BMET  Recent Labs  05/06/2013 1041  NA 138  K 4.1  CL 107  CO2 23  GLUCOSE 100*  BUN 19  CREATININE 1.18  CALCIUM 8.5   LFT  Recent Labs  05-06-2013 1041  PROT 6.4  ALBUMIN 3.5  AST 17  ALT 10  ALKPHOS 39  BILITOT 0.5    Studies/Results: Dg Chest 2 View 05-06-2013   FINDINGS: The lungs are hypo inflated. There are air fluid levels visible on the lateral film which on the frontal film overlie the lower mediastinum entered consistent with a partially intrathoracic stomach-hiatal hernia. There is nos evidence of an acute infiltrate. There may be minimal subsegmental  atelectasis at the left lung base. There is no pleural effusion. The mediastinum is normal in width. The cardiopericardial silhouette is normal in size. The pulmonary vascularity is not engorged. There is mild tortuosity of the descending thoracic aorta.  IMPRESSION: 1. There is a large hiatal hernia-partially intrathoracic stomach. 2. There is bilateral pulmonary hypo inflation. There may be minimal subsegmental atelectasis at the left lung base. There is no evidence of pneumonia. 3. There is no evidence of CHF nor pleural effusions.   Electronically Signed   By: David  [place]   On: May 06, 2013 12:01   Nm Myocar Multi W/spect W/wall Motion / Ef 05/01/2013     FINDINGS: Myocardial profusion SPECT images obtained following pharmacolocic stress are normal.  Resting exam unchanged.  No pulmonary PICC with tracer.  Minimally elevated left ventricular ejection fraction is 70% is calculated on gated SPECT images following exercise.  This is derived from an end-diastolic volume calculation of 81 mL and end systolic volume of 18 mL.  Normal wall motion analysis on review of gated SPECT images following exercise.  IMPRESSION: Normal exam.   Electronically Signed   By: Ulyses Southward M.D.   On: 05/01/2013 11:55   Dg Ugi W/high Density W/kub 05/02/2013   FINDINGS: Initial double contrast images of  the esophagus demonstrate diffuse fold thickening of the esophagus, most severe distally, concerning for esophagitis. There several tiny focal outpouchings of barium noted within the esophagus, concerning for erosions and/or tiny ulcers. No extravasation of contrast was noted during the examination. There was failure to propagate any of the primary peristaltic waves during single swallow attempts. Additionally, multiple strong tertiary contractions were noted. No definite esophageal mass or stricture was noted. A barium tablet was administered, which passed readily into the stomach. However, there was a large paraesophageal hiatal  hernia exerting mass effect on the distal 3rd of the esophagus. Approximately 1/3 to 1/2 of the stomach is intrathoracic, predominantly the fundus and proximal body of the stomach. No definite gastric mass or gastric ulcer was identified.  IMPRESSION: 1. Large paraesophageal hiatal hernia, as above. 2. Study was positive for gastroesophageal reflux. There is diffuse fold thickening of the esophageal mucosa, concerning for reflux esophagitis. Additionally, there are multiple tiny outpouchings of barium in the esophagus, concerning for small erosions and/or ulcers. Correlation with endoscopy may be warranted if clinically indicated. 3. Nonspecific esophageal motility disorder with extensive tertiary contractions.   Electronically Signed   By: Trudie Reed M.D.   On: 05/02/2013 09:38    ASSESMENT:   *  Atypical chest pain.  Ruled out for MI.  Intrathoracic stomach on cxr.  Esophagram confirms large paraesophageal hernia and esophagram.    PLAN   *  OK to eat today.  Gen surg consult called. Daily PPI.  No EGD as he just consumed the barium.  If surgery desires GI to perform future EGD please let us know.  After seen by surgery can likely go home.     Jennye Moccasin  05/02/2013, 9:49 AM Pager: [phone]  [place] [facility] Attending  I  agree with the above note.Iva Boop, MD, [facility] [phone] (pager) 05/02/2013 4:56 PM

## 2013-05-02 NOTE — Discharge Summary (Signed)
Discharge Summary   Patient ID: Troy Parker,  MRN: [id number], DOB/AGE: 06-Mar-1936 77 y.o.  Admit date: 04/30/2013 Discharge date: 05/02/2013  Primary Physician: Almedia Balls, MD Primary Cardiologist: New  Discharge Diagnoses Principal Problem:   Paraesophageal hiatal hernia  - Demonstrated on CXR, upper GI series (below)  - Tolerated PO diet this admission  - Seen by GI & surgery-> advised to follow-up w/ surgery as an outpatient to schedule repair Active Problems:   Resting tremor  - Related to anxiety   BPH (benign prostatic hyperplasia)   Hypercholesterolemia  - To resume statin therapy   Glaucoma   Anxiety  - Prescribed short course of Xanax until PCP follow-up next week  Allergies No Known Allergies  Diagnostic Studies/Procedures  PA/LATERAL CHEST X-RAY - 04/30/13  IMPRESSION:  1. There is a large hiatal hernia-partially intrathoracic stomach.  2. There is bilateral pulmonary hypo inflation. There may be minimal subsegmental atelectasis at the left lung base. There is no evidence of pneumonia.  3. There is no evidence of CHF nor pleural effusions.  LEXISCAN CARDIOLITE - 05/01/13  IMPRESSION:  Normal exam.  UPPER GI SERIES - 05/02/13  IMPRESSION:  1. Large paraesophageal hiatal hernia, as above.  2. Study was positive for gastroesophageal reflux. There is diffuse fold thickening of the esophageal mucosa, concerning for reflux esophagitis. Additionally, there are multiple tiny outpouchings of barium in the esophagus, concerning for small erosions and/or ulcers. Correlation with endoscopy may be warranted if clinically indicated.  3. Nonspecific esophageal motility disorder with extensive tertiary  contractions.  History of Present Illness  Troy Parker is a 77 y.o. male who was admitted to [facility] hospital 12/27 to 05/02/13 with the above problem list.   He has no prior cardiac history. He does have a significant family history of coronary artery  disease. He began developing epigastric discomfort last week w/o radiation or dyspnea. No aggravation with exertion. The discfomort persisted causing restlessness and anxiety. He called EMS. He was treated with NTG SL x 4 w/ eventual relief.   Initial TnI in the ED was WNL. EKG showed no ischemic changes. PA/lateral CXR as above did reveal a large hiatal hernia. The plan was made for overnight observation to pursue stress testing in the morning.   Hospital Course   Three consecutive troponins returned WNL. TSH, lipid panel as below. He underwent a Lexiscan Cardiolite study revealing no evidence of ischemia. He was evaluated by GI for further evaluation of his hiatal hernia potentially attributing to his discomfort. An upper GI series as above revealed a large paraesophageal hiatal hernia. Surgery was consulted. He tolerated lunch today without discomfort, nausea or vomiting and was felt to be stable for discharge home and outpatient surgical follow-up for repair.   He will be discharged on a PPI and short-term alprazolam for anxiety. He will follow-up with surgery as recommended and his PCP in 1 week for anxiety management. This information has been clearly outlined in the discharge AVS.   Discharge Vitals:  Blood pressure 130/59, pulse 66, temperature 98.1 F (36.7 C), temperature source Oral, resp. rate 16, height 5' 7.5" (1.715 m), weight 89.1 kg (196 lb 6.9 oz), SpO2 97.00%.   Labs: Recent Labs     05/01/13  0305  05/02/13  0445  WBC  6.8  6.0  HGB  13.7  13.8  HCT  40.9  40.8  MCV  90.1  89.9  PLT  161  161   Recent Labs Lab 04/30/13 1041  NA  138  K 4.1  CL 107  CO2 23  BUN 19  CREATININE 1.18  CALCIUM 8.5  PROT 6.4  BILITOT 0.5  ALKPHOS 39  ALT 10  AST 17  GLUCOSE 100*   Recent Labs     04/30/13  1435  04/30/13  2050  05/01/13  0305  TROPONINI  <0.30  <0.30  <0.30   Recent Labs     05/01/13  0305  CHOL  127  HDL  59  LDLCALC  55  TRIG  66  CHOLHDL  2.2     Recent Labs  16/10/96 2050  TSH 1.009   Disposition:       Follow-up Information   Follow up with [facility]. Schedule an appointment as soon as possible for a visit in 2 weeks. (Please let them know the reason is for hiatal hernia repair so they can set you up with the appropriate surgeon)    Contact information:   [address] [place] [address] [phone]      Follow up with Almedia Balls, MD In 1 week. (On 1/7 as previously scheduled )    Specialty:  Family Medicine   Contact information:   [address] [place] [address] [phone]       Discharge Medications:    Medication List         ALPRAZolam 0.25 MG tablet  Commonly known as:  XANAX  Take 1 tablet (0.25 mg total) by mouth every 12 (twelve) hours as needed for anxiety.     aspirin EC 81 MG tablet  Take 81 mg by mouth daily.     FISH OIL PO  Take 1 capsule by mouth daily.     pantoprazole 40 MG tablet  Commonly known as:  PROTONIX  Take 1 tablet (40 mg total) by mouth daily at 6 (six) AM.     rosuvastatin 20 MG tablet  Commonly known as:  CRESTOR  Take 20 mg by mouth at bedtime.     terazosin 10 MG capsule  Commonly known as:  HYTRIN  Take 10 mg by mouth at bedtime.     travoprost (benzalkonium) 0.004 % ophthalmic solution  Commonly known as:  TRAVATAN  Place 1 drop into both eyes at bedtime.       Outstanding Labs/Studies: None  Duration of Discharge Encounter: Greater than 30 minutes including physician time.  Signed, R. Hurman Horn, PA-C 05/02/2013, 2:59 PM

## 2013-05-02 NOTE — Consult Note (Signed)
May follow-up with [facility] (Dr. Michaell Cowing) as an outpatient to discuss possible paraesophageal hernia repair on an elective basis.  Wilmon Arms. Corliss Skains, MD, [facility]  General/ Trauma Surgery  05/02/2013 3:16 PM

## 2013-05-02 NOTE — Progress Notes (Signed)
Patient Name: Troy Parker Date of Encounter: 05/02/2013    Principal Problem:   Hiatal hernia Active Problems:   Chest pain, unspecified   Resting tremor   BPH (benign prostatic hyperplasia)   Hypercholesterolemia   Glaucoma    SUBJECTIVE: Feels well. Some abdominal pain last night. Anxious regarding GI work-up and treatment.    OBJECTIVE  Filed Vitals:   05/01/13 1300 05/01/13 2052 05/01/13 2100 05/02/13 0545  BP: 145/62 155/64  117/62  Pulse: 69 61  69  Temp: 97.6 F (36.4 C) 98 F (36.7 C)  98.5 F (36.9 C)  TempSrc: Oral   Oral  Resp:  16  15  Height:      Weight:   89.1 kg (196 lb 6.9 oz)   SpO2: 97% 96%  94%    Intake/Output Summary (Last 24 hours) at 05/02/13 4098 Last data filed at 05/01/13 2100  Gross per 24 hour  Intake    480 ml  Output      0 ml  Net    480 ml   Weight change: 1.5 kg (3 lb 4.9 oz)  PHYSICAL EXAM  General: Well developed, well nourished, in no acute distress. Head: Normocephalic, atraumatic, sclera non-icteric, no xanthomas, nares are without discharge.  Neck: Supple without bruits or JVD. Lungs:  Resp regular and unlabored, CTA. Heart: RRR no s3, s4, or murmurs. Abdomen: Soft, non-tender, non-distended, BS + x 4.  Msk:  Strength and tone appears normal for age. Extremities:  No clubbing, cyanosis or edema. DP/PT/Radials 2+ and equal bilaterally. Neuro: Alert and oriented X 3. Moves all extremities spontaneously. Psych: Normal affect.  LABS:  Recent Labs     05/01/13  0305  05/02/13  0445  WBC  6.8  6.0  HGB  13.7  13.8  HCT  40.9  40.8  MCV  90.1  89.9  PLT  161  161    Recent Labs Lab 04/30/13 1041  NA 138  K 4.1  CL 107  CO2 23  BUN 19  CREATININE 1.18  CALCIUM 8.5  PROT 6.4  BILITOT 0.5  ALKPHOS 39  ALT 10  AST 17  GLUCOSE 100*   Recent Labs     04/30/13  1435  04/30/13  2050  05/01/13  0305  TROPONINI  <0.30  <0.30  <0.30   Recent Labs     05/01/13  0305  CHOL  127  HDL  59    LDLCALC  55  TRIG  66  CHOLHDL  2.2    Recent Labs  04/30/13 2050  TSH 1.009    TELE: sinus bradycardia/NSR 50-70 bpm  ECG: sinus bradycardia, 52 bpm, no ST/T changes  Radiology/Studies:  Dg Chest 2 View  04/30/2013   CLINICAL DATA:  Two-day history of chest pain, prior history of tobacco use  EXAM: CHEST  2 VIEW  COMPARISON:  None.  FINDINGS: The lungs are hypo inflated. There are air fluid levels visible on the lateral film which on the frontal film overlie the lower mediastinum entered consistent with a partially intrathoracic stomach-hiatal hernia. There is nos evidence of an acute infiltrate. There may be minimal subsegmental atelectasis at the left lung base. There is no pleural effusion. The mediastinum is normal in width. The cardiopericardial silhouette is normal in size. The pulmonary vascularity is not engorged. There is mild tortuosity of the descending thoracic aorta.  IMPRESSION: 1. There is a large hiatal hernia-partially intrathoracic stomach. 2. There is bilateral pulmonary hypo inflation. There  may be minimal subsegmental atelectasis at the left lung base. There is no evidence of pneumonia. 3. There is no evidence of CHF nor pleural effusions.   Electronically Signed   By: David  [place]   On: 04/30/2013 12:01   Nm Myocar Multi W/spect W/wall Motion / Ef  05/01/2013   CLINICAL DATA:  Unstable angina, history hypercholesterolemia  EXAM: MYOCARDIAL IMAGING WITH SPECT (REST AND PHARMACOLOGIC-STRESS)  GATED LEFT VENTRICULAR WALL MOTION STUDY  LEFT VENTRICULAR EJECTION FRACTION  TECHNIQUE: Standard myocardial SPECT imaging was performed after resting intravenous injection of 10 mCi Tc-36m sestamibi. Subsequently, intravenous infusion of Lexiscan was performed under the supervision of the Cardiology staff. At peak effect of the drug, 30 mCi Tc-33m sestamibi was injected intravenously and standard myocardial SPECT imaging was performed. Quantitative gated imaging was also performed  to evaluate left ventricular wall motion, and estimate left ventricular ejection fraction.  COMPARISON:  None.  FINDINGS: Myocardial profusion SPECT images obtained following pharmacolocic stress are normal.  Resting exam unchanged.  No pulmonary PICC with tracer.  Minimally elevated left ventricular ejection fraction is 70% is calculated on gated SPECT images following exercise.  This is derived from an end-diastolic volume calculation of 81 mL and end systolic volume of 18 mL.  Normal wall motion analysis on review of gated SPECT images following exercise.  IMPRESSION: Normal exam.   Electronically Signed   By: Ulyses Southward M.D.   On: 05/01/2013 11:55    Current Medications:  . aspirin EC  81 mg Oral Daily  . atorvastatin  40 mg Oral q1800  . omega-3 acid ethyl esters  1 g Oral Daily  . pantoprazole  40 mg Oral Q0600  . sodium chloride  3 mL Intravenous Q12H  . terazosin  10 mg Oral QHS  . Travoprost (BAK Free)  1 drop Both Eyes QHS    ASSESSMENT AND PLAN:  1. Chest/epigastric pain- ruled out for MI. Lexiscan Cardiolite yesterday normal, no evidence of ischemia. Attributed to #2. GI consulted, underwent GI eval today. Further recs pending.   2. Large paraesophageal hiatal hernia- appreciated on UGI series/barium swallow this AM accounting for symptoms. Esophageal reflux and motility disorder. GI recommends surgery consult. -- Continue PPI  3. Hyperlipidemia- continue statin.   4. Anxiety- short course of Xanax until PCP follow-up on 1/7.   Dispo- awaiting surgical consult recommended by GI. The patient is stable from a cardiac perspective. Depending on plan (outpatient vs inpatient), plan discharge today.  Signed, R. Hurman Horn, PA-C 05/02/2013, 9:49 AM  Patient seen with PA, agree with the above note.  Large paraesophageal hernia on UGI series likely caused his abdominal/chest symptoms.  He was able to eat lunch this afternoon with no problems. I will let him go home to followup as  an outpatient with surgery to set up hernia repair surgery.  He does not need cardiology followup.  Marca Ancona 05/02/2013 2:24 PM

## 2013-05-02 NOTE — Consult Note (Signed)
Troy Parker 10-Mar-1936  [id number].   Primary Care MD: Almedia Balls, MD Requesting MD: Dr. Stan Head Chief Complaint/Reason for Consult: paraesophageal hernia HPI: This is an otherwise healthy 77 yo white male who has been having some pressure in his chest after eating for several years.  It was never significant and didn't think much of it.  He states that sometimes he feels like it takes his food a long time to go down but no difficulty swallowing or pain with swallowing.  He denies any abdominal pain.  No nausea or vomiting.    He ate a large Christmas meal and then got significant chest pressure.  He took an anxiety pill which didn't really help.  He presented to the [facility] where he was admitted for observation.  Cardiac issues were all ruled out.  He was noted to have part of his stomach in his chest on CXR.  GI was consulted and an UGI was ordered today.  This revealed a large paraesophageal hiatal hernia with some areas of thickness with concern for reflux and small erosions or ulcers.  We have been asked to evaluate the patient for further recommendations.  ROS: Please see HPI, otherwise all other systems have been reviewed and are negative.  Family History  Problem Relation Age of Onset  . CAD Mother     h/o cabg and redo cabg - died in her 46's  . CAD Father     h/o cabg - died in his 83's  . CAD Brother     s/p cabg  . CAD Brother     s/p cabg  . Stroke Brother   . Other Brother     pts twin -     Past Medical History  Diagnosis Date  . Glaucoma   . Hypercholesterolemia   . BPH (benign prostatic hyperplasia)   . Nephrolithiasis     a. remote surgical removal.  . Chest pain     a. h/o nl cath in his 41's - [place].  Marland Kitchen Resting tremor     a. first noticed 03/2013 - left hand and chin/mouth.  . Hiatal hernia     a. noted on cxr 04/2013.    Past Surgical History  Procedure Laterality Date  . Eye surgery  04/11/13    cataract removal R eye  . Kidney stone surgery    .  Back surgery    . Foot surgery      x2    Social History:  reports that he has never smoked. He does not have any smokeless tobacco history on file. He reports that he drinks alcohol. He reports that he does not use illicit drugs.  Allergies: No Known Allergies  Medications Prior to Admission  Medication Sig Dispense Refill  . aspirin EC 81 MG tablet Take 81 mg by mouth daily.      . Omega-3 Fatty Acids (FISH OIL PO) Take 1 capsule by mouth daily.      . rosuvastatin (CRESTOR) 20 MG tablet Take 20 mg by mouth at bedtime.      Marland Kitchen terazosin (HYTRIN) 10 MG capsule Take 10 mg by mouth at bedtime.      . travoprost, benzalkonium, (TRAVATAN) 0.004 % ophthalmic solution Place 1 drop into both eyes at bedtime.        Blood pressure 117/62, pulse 69, temperature 98.5 F (36.9 C), temperature source Oral, resp. rate 15, height 5' 7.5" (1.715 m), weight 196 lb 6.9 oz (89.1 kg), SpO2 94.00%. Physical  Exam: General: pleasant, WD, WN white male who is laying in bed in NAD HEENT: head is normocephalic, atraumatic.  Sclera are noninjected.  PERRL.  Ears and nose without any masses or lesions.  Mouth is pink and moist Heart: regular, rate, and rhythm.  Normal s1,s2. No obvious murmurs, gallops, or rubs noted.  Palpable radial and pedal pulses bilaterally Lungs: CTAB, no wheezes, rhonchi, or rales noted.  Respiratory effort nonlabored Abd: soft, NT, ND, +BS, no masses, hernias, or organomegaly MS: all 4 extremities are symmetrical with no cyanosis, clubbing, or edema. Skin: warm and dry with no masses, lesions, or rashes Psych: A&Ox3 with an appropriate affect.    Results for orders placed during the hospital encounter of 04/30/13 (from the past 48 hour(s))  TROPONIN I     Status: None   Collection Time    04/30/13  2:35 PM      Result Value Range   Troponin I <0.30  <0.30 ng/mL   Comment:            Due to the release kinetics of cTnI,     a negative result within the first hours     of the  onset of symptoms does not rule out     myocardial infarction with certainty.     If myocardial infarction is still suspected,     repeat the test at appropriate intervals.  URINALYSIS, ROUTINE W REFLEX MICROSCOPIC     Status: Abnormal   Collection Time    04/30/13  3:01 PM      Result Value Range   Color, Urine YELLOW  YELLOW   APPearance CLEAR  CLEAR   Specific Gravity, Urine 1.022  1.005 - 1.030   pH 5.5  5.0 - 8.0   Glucose, UA NEGATIVE  NEGATIVE mg/dL   Hgb urine dipstick NEGATIVE  NEGATIVE   Bilirubin Urine NEGATIVE  NEGATIVE   Ketones, ur 15 (*) NEGATIVE mg/dL   Protein, ur NEGATIVE  NEGATIVE mg/dL   Urobilinogen, UA 0.2  0.0 - 1.0 mg/dL   Nitrite NEGATIVE  NEGATIVE   Leukocytes, UA NEGATIVE  NEGATIVE   Comment: MICROSCOPIC NOT DONE ON URINES WITH NEGATIVE PROTEIN, BLOOD, LEUKOCYTES, NITRITE, OR GLUCOSE <1000 mg/dL.  TROPONIN I     Status: None   Collection Time    04/30/13  8:50 PM      Result Value Range   Troponin I <0.30  <0.30 ng/mL   Comment:            Due to the release kinetics of cTnI,     a negative result within the first hours     of the onset of symptoms does not rule out     myocardial infarction with certainty.     If myocardial infarction is still suspected,     repeat the test at appropriate intervals.  TSH     Status: None   Collection Time    04/30/13  8:50 PM      Result Value Range   TSH 1.009  0.350 - 4.500 uIU/mL   Comment: Performed at [facility]  HEPARIN LEVEL (UNFRACTIONATED)     Status: None   Collection Time    04/30/13 11:17 PM      Result Value Range   Heparin Unfractionated 0.36  0.30 - 0.70 IU/mL   Comment:            IF HEPARIN RESULTS ARE BELOW     EXPECTED VALUES, AND PATIENT  DOSAGE HAS BEEN CONFIRMED,     SUGGEST FOLLOW UP TESTING     OF ANTITHROMBIN III LEVELS.  TROPONIN I     Status: None   Collection Time    05/01/13  3:05 AM      Result Value Range   Troponin I <0.30  <0.30 ng/mL   Comment:             Due to the release kinetics of cTnI,     a negative result within the first hours     of the onset of symptoms does not rule out     myocardial infarction with certainty.     If myocardial infarction is still suspected,     repeat the test at appropriate intervals.  LIPID PANEL     Status: None   Collection Time    05/01/13  3:05 AM      Result Value Range   Cholesterol 127  0 - 200 mg/dL   Triglycerides 66  <161 mg/dL   HDL 59  >09 mg/dL   Total CHOL/HDL Ratio 2.2     VLDL 13  0 - 40 mg/dL   LDL Cholesterol 55  0 - 99 mg/dL   Comment:            Total Cholesterol/HDL:CHD Risk     Coronary Heart Disease Risk Table                         Men   Women      1/2 Average Risk   3.4   3.3      Average Risk       5.0   4.4      2 X Average Risk   9.6   7.1      3 X Average Risk  23.4   11.0                Use the calculated Patient Ratio     above and the CHD Risk Table     to determine the patient's CHD Risk.                ATP III CLASSIFICATION (LDL):      <100     mg/dL   Optimal      604-540  mg/dL   Near or Above                        Optimal      130-159  mg/dL   Borderline      981-191  mg/dL   High      >478     mg/dL   Very High  CBC     Status: None   Collection Time    05/01/13  3:05 AM      Result Value Range   WBC 6.8  4.0 - 10.5 K/uL   RBC 4.54  4.22 - 5.81 MIL/uL   Hemoglobin 13.7  13.0 - 17.0 g/dL   HCT 29.5  62.1 - 30.8 %   MCV 90.1  78.0 - 100.0 fL   MCH 30.2  26.0 - 34.0 pg   MCHC 33.5  30.0 - 36.0 g/dL   RDW 65.7  84.6 - 96.2 %   Platelets 161  150 - 400 K/uL  HEPARIN LEVEL (UNFRACTIONATED)     Status: Abnormal   Collection Time    05/01/13  3:05 AM  Result Value Range   Heparin Unfractionated 0.18 (*) 0.30 - 0.70 IU/mL   Comment:            IF HEPARIN RESULTS ARE BELOW     EXPECTED VALUES, AND PATIENT     DOSAGE HAS BEEN CONFIRMED,     SUGGEST FOLLOW UP TESTING     OF ANTITHROMBIN III LEVELS.  CBC     Status: None   Collection Time     05/02/13  4:45 AM      Result Value Range   WBC 6.0  4.0 - 10.5 K/uL   RBC 4.54  4.22 - 5.81 MIL/uL   Hemoglobin 13.8  13.0 - 17.0 g/dL   HCT 16.1  09.6 - 04.5 %   MCV 89.9  78.0 - 100.0 fL   MCH 30.4  26.0 - 34.0 pg   MCHC 33.8  30.0 - 36.0 g/dL   RDW 40.9  81.1 - 91.4 %   Platelets 161  150 - 400 K/uL   Dg Chest 2 View  04/30/2013   CLINICAL DATA:  Two-day history of chest pain, prior history of tobacco use  EXAM: CHEST  2 VIEW  COMPARISON:  None.  FINDINGS: The lungs are hypo inflated. There are air fluid levels visible on the lateral film which on the frontal film overlie the lower mediastinum entered consistent with a partially intrathoracic stomach-hiatal hernia. There is nos evidence of an acute infiltrate. There may be minimal subsegmental atelectasis at the left lung base. There is no pleural effusion. The mediastinum is normal in width. The cardiopericardial silhouette is normal in size. The pulmonary vascularity is not engorged. There is mild tortuosity of the descending thoracic aorta.  IMPRESSION: 1. There is a large hiatal hernia-partially intrathoracic stomach. 2. There is bilateral pulmonary hypo inflation. There may be minimal subsegmental atelectasis at the left lung base. There is no evidence of pneumonia. 3. There is no evidence of CHF nor pleural effusions.   Electronically Signed   By: David  [place]   On: 04/30/2013 12:01   Nm Myocar Multi W/spect W/wall Motion / Ef  05/01/2013   CLINICAL DATA:  Unstable angina, history hypercholesterolemia  EXAM: MYOCARDIAL IMAGING WITH SPECT (REST AND PHARMACOLOGIC-STRESS)  GATED LEFT VENTRICULAR WALL MOTION STUDY  LEFT VENTRICULAR EJECTION FRACTION  TECHNIQUE: Standard myocardial SPECT imaging was performed after resting intravenous injection of 10 mCi Tc-64m sestamibi. Subsequently, intravenous infusion of Lexiscan was performed under the supervision of the Cardiology staff. At peak effect of the drug, 30 mCi Tc-55m sestamibi was injected  intravenously and standard myocardial SPECT imaging was performed. Quantitative gated imaging was also performed to evaluate left ventricular wall motion, and estimate left ventricular ejection fraction.  COMPARISON:  None.  FINDINGS: Myocardial profusion SPECT images obtained following pharmacolocic stress are normal.  Resting exam unchanged.  No pulmonary PICC with tracer.  Minimally elevated left ventricular ejection fraction is 70% is calculated on gated SPECT images following exercise.  This is derived from an end-diastolic volume calculation of 81 mL and end systolic volume of 18 mL.  Normal wall motion analysis on review of gated SPECT images following exercise.  IMPRESSION: Normal exam.   Electronically Signed   By: Ulyses Southward M.D.   On: 05/01/2013 11:55   Dg Ugi W/high Density W/kub  05/02/2013   CLINICAL DATA:  Abdominal pain.  EXAM: UPPER GI SERIES W/HIGH DENSITY W/KUB  TECHNIQUE: After obtaining a scout radiograph a routine upper GI series was performed using thin and  high density barium.  COMPARISON:  No priors.  FLUOROSCOPY TIME:  4 min and 28 seconds.  FINDINGS: Initial double contrast images of the esophagus demonstrate diffuse fold thickening of the esophagus, most severe distally, concerning for esophagitis. There several tiny focal outpouchings of barium noted within the esophagus, concerning for erosions and/or tiny ulcers. No extravasation of contrast was noted during the examination. There was failure to propagate any of the primary peristaltic waves during single swallow attempts. Additionally, multiple strong tertiary contractions were noted. No definite esophageal mass or stricture was noted. A barium tablet was administered, which passed readily into the stomach. However, there was a large paraesophageal hiatal hernia exerting mass effect on the distal 3rd of the esophagus. Approximately 1/3 to 1/2 of the stomach is intrathoracic, predominantly the fundus and proximal body of the  stomach. No definite gastric mass or gastric ulcer was identified.  IMPRESSION: 1. Large paraesophageal hiatal hernia, as above. 2. Study was positive for gastroesophageal reflux. There is diffuse fold thickening of the esophageal mucosa, concerning for reflux esophagitis. Additionally, there are multiple tiny outpouchings of barium in the esophagus, concerning for small erosions and/or ulcers. Correlation with endoscopy may be warranted if clinically indicated. 3. Nonspecific esophageal motility disorder with extensive tertiary contractions.   Electronically Signed   By: Trudie Reed M.D.   On: 05/02/2013 09:38       Assessment/Plan 1. Large paraesophageal hernia Patient Active Problem List   Diagnosis Date Noted  . Chest pain, unspecified 05/01/2013  . Resting tremor   . BPH (benign prostatic hyperplasia)   . Hypercholesterolemia   . Glaucoma   . Hiatal hernia   . Precordial pain 04/30/2013    Plan: 1. The patient does not seem to be acutely symptomatic right now with concerns for needing urgent surgical intervention, such as gastric volvulus.  He is having pressure after eating.  If he can eat today, then it is ok for the patient to go home and follow up with our surgeons as an outpatient to set up elective repair.  If the patient eats today and has severe pain, nausea, or vomiting, he may require a more urgent repair while inpatient.  Thank you for this consultation.   Gavino Fouch E 05/02/2013, 11:26 AM Pager: [phone]

## 2013-05-13 ENCOUNTER — Telehealth (INDEPENDENT_AMBULATORY_CARE_PROVIDER_SITE_OTHER): Payer: Self-pay | Admitting: *Deleted

## 2013-05-13 NOTE — Telephone Encounter (Signed)
Pt called in stating that he has an appt with Dr. Derrell Lollingamirez on 05/17/13 for a hiatal hernia and he has been having a lot of diarrhea and gas.  He was prescribed protonix when he was in the hospital however he was wondering what he could take to help with the diarrhea.  I instructed him to try some OTC immodium and see if this helps relieve the diarrhea.  I instructed him to continue taking the protonix.  He is agreeable with this plan and will call back if anything changes.

## 2013-05-17 ENCOUNTER — Ambulatory Visit (INDEPENDENT_AMBULATORY_CARE_PROVIDER_SITE_OTHER): Payer: Medicare Other | Admitting: General Surgery

## 2013-05-17 ENCOUNTER — Encounter (INDEPENDENT_AMBULATORY_CARE_PROVIDER_SITE_OTHER): Payer: Self-pay | Admitting: General Surgery

## 2013-05-17 VITALS — BP 148/92 | HR 76 | Temp 97.6°F | Resp 16 | Ht 67.5 in | Wt 189.0 lb

## 2013-05-17 DIAGNOSIS — K449 Diaphragmatic hernia without obstruction or gangrene: Secondary | ICD-10-CM

## 2013-05-17 NOTE — Progress Notes (Signed)
Subjective:     Patient ID: Troy Parker, male   DOB: 03/04/1936, 78 y.o.   MRN: [id number]  HPI The patient is a 78 year old male who was previously seen in the hospital secondary to a hiatal hernia and abdominal pain. Patient initially was ruled out for chest/cardiac issues.  Patient underwent evaluation with an upper GI series which reveals a large intrathoracic portion of the stomach. The patient states he has been dealing with a large amount of anxiety.  The patient has seen his primary care physician, Rita OharaSamuel Kelley, has been getting Xanax. He states the medications make him dizzy and has not been taking him. He states that since his initial hospitalization he had anxiety since that time.  The patient did not undergo an EGD while in the hospital.  Review of Systems  Constitutional: Negative.   HENT: Negative.   Eyes: Negative.   Respiratory: Negative.   Cardiovascular: Negative.   Gastrointestinal: Negative.   Endocrine: Negative.   Neurological: Negative.        Objective:   Physical Exam  Constitutional: He is oriented to person, place, and time. He appears well-developed and well-nourished.  HENT:  Head: Normocephalic and atraumatic.  Eyes: Conjunctivae and EOM are normal. Pupils are equal, round, and reactive to light.  Neck: Normal range of motion. Neck supple.  Cardiovascular: Normal rate, regular rhythm and normal heart sounds.   Pulmonary/Chest: Effort normal and breath sounds normal.  Abdominal: Soft. Bowel sounds are normal. He exhibits no distension and no mass. There is no tenderness. There is no rebound and no guarding.  Musculoskeletal: Normal range of motion.  Neurological: He is alert and oriented to person, place, and time.  Skin: Skin is warm and dry.       Assessment:     78 year old male with hiatal hernia and anxiety.     Plan:     1. I will attempt to speak with his primary care physician in regards to his anxiety. Only the patient ate some  medication that is productive to assisting with alleviating his anxiety. 2. We'll also get a CT scan of his chest abdomen and pelvis to help assess anatomy. 3. Based on the CT scan we may need to get an EGD per gastroenterology. Patient has seen Dr. Elberta LeatherwoodGessener in the past.

## 2013-05-18 ENCOUNTER — Emergency Department (HOSPITAL_COMMUNITY)
Admission: EM | Admit: 2013-05-18 | Discharge: 2013-05-18 | Disposition: A | Discharge status: Home / Self Care | Payer: Medicare Other | Attending: Emergency Medicine | Admitting: Emergency Medicine

## 2013-05-18 ENCOUNTER — Other Ambulatory Visit: Payer: Self-pay

## 2013-05-18 ENCOUNTER — Encounter (HOSPITAL_COMMUNITY): Payer: Self-pay | Admitting: Emergency Medicine

## 2013-05-18 DIAGNOSIS — E7800 Pure hypercholesterolemia, unspecified: Secondary | ICD-10-CM | POA: Diagnosis not present

## 2013-05-18 DIAGNOSIS — R259 Unspecified abnormal involuntary movements: Secondary | ICD-10-CM | POA: Insufficient documentation

## 2013-05-18 DIAGNOSIS — Z8669 Personal history of other diseases of the nervous system and sense organs: Secondary | ICD-10-CM | POA: Insufficient documentation

## 2013-05-18 DIAGNOSIS — R209 Unspecified disturbances of skin sensation: Secondary | ICD-10-CM | POA: Diagnosis not present

## 2013-05-18 DIAGNOSIS — Z87442 Personal history of urinary calculi: Secondary | ICD-10-CM | POA: Insufficient documentation

## 2013-05-18 DIAGNOSIS — E785 Hyperlipidemia, unspecified: Secondary | ICD-10-CM | POA: Insufficient documentation

## 2013-05-18 DIAGNOSIS — R55 Syncope and collapse: Secondary | ICD-10-CM

## 2013-05-18 DIAGNOSIS — F411 Generalized anxiety disorder: Secondary | ICD-10-CM | POA: Insufficient documentation

## 2013-05-18 DIAGNOSIS — F419 Anxiety disorder, unspecified: Secondary | ICD-10-CM

## 2013-05-18 DIAGNOSIS — Z8719 Personal history of other diseases of the digestive system: Secondary | ICD-10-CM | POA: Insufficient documentation

## 2013-05-18 DIAGNOSIS — Z87448 Personal history of other diseases of urinary system: Secondary | ICD-10-CM | POA: Diagnosis not present

## 2013-05-18 DIAGNOSIS — Z7982 Long term (current) use of aspirin: Secondary | ICD-10-CM | POA: Diagnosis not present

## 2013-05-18 DIAGNOSIS — Z79899 Other long term (current) drug therapy: Secondary | ICD-10-CM | POA: Insufficient documentation

## 2013-05-18 DIAGNOSIS — R42 Dizziness and giddiness: Secondary | ICD-10-CM | POA: Diagnosis present

## 2013-05-18 LAB — URINALYSIS, ROUTINE W REFLEX MICROSCOPIC
BILIRUBIN URINE: NEGATIVE
Glucose, UA: NEGATIVE mg/dL
HGB URINE DIPSTICK: NEGATIVE
Ketones, ur: 15 mg/dL — AB
Leukocytes, UA: NEGATIVE
NITRITE: NEGATIVE
PROTEIN: NEGATIVE mg/dL
Specific Gravity, Urine: 1.019 (ref 1.005–1.030)
Urobilinogen, UA: 0.2 mg/dL (ref 0.0–1.0)
pH: 7 (ref 5.0–8.0)

## 2013-05-18 LAB — CBC WITH DIFFERENTIAL/PLATELET
BASOS ABS: 0 10*3/uL (ref 0.0–0.1)
Basophils Relative: 0 % (ref 0–1)
EOS PCT: 2 % (ref 0–5)
Eosinophils Absolute: 0.1 10*3/uL (ref 0.0–0.7)
HEMATOCRIT: 41.6 % (ref 39.0–52.0)
Hemoglobin: 14.9 g/dL (ref 13.0–17.0)
LYMPHS ABS: 1 10*3/uL (ref 0.7–4.0)
LYMPHS PCT: 18 % (ref 12–46)
MCH: 31.3 pg (ref 26.0–34.0)
MCHC: 35.8 g/dL (ref 30.0–36.0)
MCV: 87.4 fL (ref 78.0–100.0)
MONOS PCT: 11 % (ref 3–12)
Monocytes Absolute: 0.6 10*3/uL (ref 0.1–1.0)
Neutro Abs: 3.8 10*3/uL (ref 1.7–7.7)
Neutrophils Relative %: 69 % (ref 43–77)
Platelets: 249 10*3/uL (ref 150–400)
RBC: 4.76 MIL/uL (ref 4.22–5.81)
RDW: 12.6 % (ref 11.5–15.5)
WBC: 5.6 10*3/uL (ref 4.0–10.5)

## 2013-05-18 LAB — COMPREHENSIVE METABOLIC PANEL
ALT: 15 U/L (ref 0–53)
ALT: 11 U/L (ref 0–53)
AST: 61 U/L — AB (ref 0–37)
AST: 22 U/L (ref 0–37)
Albumin: 4.1 g/dL (ref 3.5–5.2)
Albumin: 3.6 g/dL (ref 3.5–5.2)
Alkaline Phosphatase: 29 U/L — ABNORMAL LOW (ref 39–117)
Alkaline Phosphatase: 42 U/L (ref 39–117)
BILIRUBIN TOTAL: 0.7 mg/dL (ref 0.3–1.2)
BILIRUBIN TOTAL: 0.6 mg/dL (ref 0.3–1.2)
BUN: 17 mg/dL (ref 6–23)
BUN: 16 mg/dL (ref 6–23)
CALCIUM: 8.7 mg/dL (ref 8.4–10.5)
CHLORIDE: 102 meq/L (ref 96–112)
CHLORIDE: 104 meq/L (ref 96–112)
CO2: 24 meq/L (ref 19–32)
CO2: 26 meq/L (ref 19–32)
CREATININE: 1.27 mg/dL (ref 0.50–1.35)
Calcium: 9.4 mg/dL (ref 8.4–10.5)
Creatinine, Ser: 1.23 mg/dL (ref 0.50–1.35)
GFR calc Af Amer: 62 mL/min — ABNORMAL LOW (ref >90)
GFR calc non Af Amer: 56 mL/min — ABNORMAL LOW (ref >90)
GFR, EST AFRICAN AMERICAN: 65 mL/min — AB (ref >90)
GFR, EST NON AFRICAN AMERICAN: 54 mL/min — AB (ref >90)
GLUCOSE: 112 mg/dL — AB (ref 70–99)
Glucose, Bld: 106 mg/dL — ABNORMAL HIGH (ref 70–99)
Potassium: 5.9 mEq/L — ABNORMAL HIGH (ref 3.7–5.3)
Potassium: 4.6 mEq/L (ref 3.7–5.3)
Sodium: 140 mEq/L (ref 137–147)
Sodium: 140 mEq/L (ref 137–147)
Total Protein: 7.8 g/dL (ref 6.0–8.3)
Total Protein: 6.6 g/dL (ref 6.0–8.3)

## 2013-05-18 LAB — POCT I-STAT TROPONIN I: Troponin i, poc: 0.01 ng/mL (ref 0.00–0.08)

## 2013-05-18 MED ORDER — LORAZEPAM 2 MG/ML IJ SOLN
0.5000 mg | Freq: Once | INTRAMUSCULAR | Status: AC
  Administered 2013-05-18: 0.5 mg via INTRAVENOUS
  Filled 2013-05-18: qty 1

## 2013-05-18 MED ORDER — SODIUM CHLORIDE 0.9 % IV BOLUS (SEPSIS)
1000.0000 mL | Freq: Once | INTRAVENOUS | Status: AC
  Administered 2013-05-18: 1000 mL via INTRAVENOUS

## 2013-05-18 MED ORDER — LORAZEPAM 2 MG/ML IJ SOLN
1.0000 mg | Freq: Once | INTRAMUSCULAR | Status: DC
Start: 2013-05-18 — End: 2013-05-18

## 2013-05-18 NOTE — ED Provider Notes (Signed)
CSN: [id number]     Arrival date & time 05/18/13  [id number] History   First MD Initiated Contact with Patient 05/18/13 [phone]     Chief Complaint  Patient presents with  . Dizziness   (Consider location/radiation/quality/duration/timing/severity/associated sxs/prior Treatment) HPI  This a 78 year old male with history of hyperlipidemia, resting tremor, and hiatal hernia who presents with presyncope. Patient states that he felt very anxious yesterday prior to an appointment with surgery. He had to have his son take him to the appointment. Patient states that when he got home he start to feel better. However, last night he he had an episode of "head numbness in bilateral upper extremity shaking." He states "I felt like I was going to pass out."  Patient did not syncopized. Patient denies any fevers or recent illnesses. He was prescribed Xanax by his primary physician but has not taken any. Patient endorses stressors in his life including recent loss of wife. Patient denies any shortness of breath or chest pain. He denies any focal weakness or numbness.  Past Medical History  Diagnosis Date  . Glaucoma   . Hypercholesterolemia   . BPH (benign prostatic hyperplasia)   . Nephrolithiasis     a. remote surgical removal.  . Chest pain     a. h/o nl cath in his 940's - [place].  Marland Kitchen. Resting tremor     a. first noticed 03/2013 - left hand and chin/mouth.  . Paraesophageal hiatal hernia     a. noted on cxr 04/2013. b. confirmed on UGI series 04/2013, to f/u with surgery as an OP  . Anxiety    Past Surgical History  Procedure Laterality Date  . Eye surgery  04/11/13    cataract removal R eye  . Kidney stone surgery    . Back surgery    . Foot surgery      x2   Family History  Problem Relation Age of Onset  . CAD Mother     h/o cabg and redo cabg - died in her 4570's  . CAD Father     h/o cabg - died in his 6450's  . CAD Brother     s/p cabg  . CAD Brother     s/p cabg  . Stroke Brother   . Other  Brother     pts twin -    History  Substance Use Topics  . Smoking status: Never Smoker   . Smokeless tobacco: Not on file  . Alcohol Use: Yes     Comment: 1 drink daily.    Review of Systems  Constitutional: Negative.  Negative for fever.  Respiratory: Negative.  Negative for chest tightness and shortness of breath.   Cardiovascular: Negative.  Negative for chest pain.  Gastrointestinal: Negative.  Negative for abdominal pain.  Genitourinary: Negative.  Negative for dysuria.  Musculoskeletal: Negative for back pain.  Skin: Negative for rash.  Neurological: Positive for dizziness, tremors and light-headedness. Negative for seizures, syncope, weakness, numbness and headaches.  All other systems reviewed and are negative.    Allergies  Review of patient's allergies indicates no known allergies.  Home Medications   Current Outpatient Rx  Name  Route  Sig  Dispense  Refill  . aspirin EC 81 MG tablet   Oral   Take 81 mg by mouth daily.         . Ca Carbonate-Mag Hydroxide (ROLAIDS PO)   Oral   Take 1 tablet by mouth 5 (five) times daily as needed (indigestion).         .Marland Kitchen  pantoprazole (PROTONIX) 40 MG tablet   Oral   Take 1 tablet (40 mg total) by mouth daily at 6 (six) AM.   30 tablet   3   . travoprost, benzalkonium, (TRAVATAN) 0.004 % ophthalmic solution   Both Eyes   Place 1 drop into both eyes at bedtime.         . ALPRAZolam (XANAX) 0.25 MG tablet   Oral   Take 1 tablet (0.25 mg total) by mouth every 12 (twelve) hours as needed for anxiety.   15 tablet   0   . Omega-3 Fatty Acids (FISH OIL PO)   Oral   Take 1 capsule by mouth at bedtime.          . rosuvastatin (CRESTOR) 20 MG tablet   Oral   Take 20 mg by mouth at bedtime.         Marland Kitchen terazosin (HYTRIN) 10 MG capsule   Oral   Take 10 mg by mouth at bedtime.          BP 124/55  Pulse 63  Temp(Src) 97.7 F (36.5 C) (Oral)  Resp 12  SpO2 97% Physical Exam  Nursing note and vitals  reviewed. Constitutional: He is oriented to person, place, and time. He appears well-developed and well-nourished.  Anxious appearing  HENT:  Head: Normocephalic and atraumatic.  Eyes: Pupils are equal, round, and reactive to light.  Neck: Neck supple.  Cardiovascular: Normal rate, regular rhythm and normal heart sounds.   No murmur heard. Pulmonary/Chest: Effort normal and breath sounds normal. No respiratory distress. He has no wheezes.  Abdominal: Soft. Bowel sounds are normal. There is tenderness. There is no rebound and no guarding.  Musculoskeletal: He exhibits no edema.  Lymphadenopathy:    He has no cervical adenopathy.  Neurological: He is alert and oriented to person, place, and time. No cranial nerve deficit. Coordination normal.  5 out of 5 strength in all 4 extremities  Skin: Skin is warm and dry.  Psychiatric:  Anxious    ED Course  Procedures (including critical care time) Labs Review Labs Reviewed  COMPREHENSIVE METABOLIC PANEL - Abnormal; Notable for the following:    Potassium 5.9 (*)    Glucose, Bld 112 (*)    AST 61 (*)    Alkaline Phosphatase 29 (*)    GFR calc non Af Amer 56 (*)    GFR calc Af Amer 65 (*)    All other components within normal limits  URINALYSIS, ROUTINE W REFLEX MICROSCOPIC - Abnormal; Notable for the following:    Ketones, ur 15 (*)    All other components within normal limits  COMPREHENSIVE METABOLIC PANEL - Abnormal; Notable for the following:    Glucose, Bld 106 (*)    GFR calc non Af Amer 54 (*)    GFR calc Af Amer 62 (*)    All other components within normal limits  CBC WITH DIFFERENTIAL  POCT I-STAT TROPONIN I   Imaging Review No results found.  EKG Interpretation   None      EKG independently reviewed by myself: Normal sinus rhythm with a rate of 71, no evidence of acute ST elevation or ischemia, no significant change from prior  MDM   1. Anxiety   2. Near syncope    Patient presents with episode of near syncope  and dizziness. He is anxious appearing but nontoxic on exam.  He denies any focal deficit. Patient has a recent history of stressors and reports anxiety. Patient was  given 1 mg of Ativan. Initial blood work was notable for potassium of 5.9 which appear to be hemolyzed. Repeat potassium is 4.6. EKG and troponin are reassuring. Patient felt improved following Ativan. Rest of his workup is unremarkable. I discussed with the patient that this episode may have been secondary to anxiety. He has a followup appointment with his primary doctor tomorrow.  After history, exam, and medical workup I feel the patient has been appropriately medically screened and is safe for discharge home. Pertinent diagnoses were discussed with the patient. Patient was given return precautions.     Shon Baton, MD 05/18/13 [phone]

## 2013-05-18 NOTE — ED Notes (Signed)
Pt here from home with feelings of nervousness and shaking , pt very nervous in room , no chest pain , pt had been prescribed xanax from his primary but stopped taking them

## 2013-05-18 NOTE — Discharge Instructions (Signed)
Near-Syncope Near-syncope (commonly known as near fainting) is sudden weakness, dizziness, or feeling like you might pass out. During an episode of near-syncope, you may also develop pale skin, have tunnel vision, or feel sick to your stomach (nauseous). Near-syncope may occur when getting up after sitting or while standing for a long time. It is caused by a sudden decrease in blood flow to the brain. This decrease can result from various causes or triggers, most of which are not serious. However, because near-syncope can sometimes be a sign of something serious, a medical evaluation is required. The specific cause is often not determined.  This episode may have been due to anxiety.  HOME CARE INSTRUCTIONS  Monitor your condition for any changes. The following actions may help to alleviate any discomfort you are experiencing:  Have someone stay with you until you feel stable.  Lie down right away if you start feeling like you might faint. Breathe deeply and steadily. Wait until all the symptoms have passed. Most of these episodes last only a few minutes. You may feel tired for several hours.   Drink enough fluids to keep your urine clear or pale yellow.   If you are taking blood pressure or heart medicine, get up slowly when seated or lying down. Take several minutes to sit and then stand. This can reduce dizziness.  Follow up with your health care provider as directed. SEEK IMMEDIATE MEDICAL CARE IF:   You have a severe headache.   You have unusual pain in the chest, abdomen, or back.   You are bleeding from the mouth or rectum, or you have black or tarry stool.   You have an irregular or very fast heartbeat.   You have repeated fainting or have seizure-like jerking during an episode.   You faint when sitting or lying down.   You have confusion.   You have difficulty walking.   You have severe weakness.   You have vision problems.  MAKE SURE YOU:   Understand  these instructions.  Will watch your condition.  Will get help right away if you are not doing well or get worse. Document Released: 04/21/2005 Document Revised: 12/22/2012 Document Reviewed: 09/24/2012 [facility] Patient Information 2014 [employer] [place].

## 2013-05-18 NOTE — ED Notes (Signed)
Pt states that he feels a "little" better since he received the ativan

## 2013-05-18 NOTE — ED Notes (Signed)
Order for redraw of Cmp

## 2013-05-20 ENCOUNTER — Ambulatory Visit
Admission: RE | Admit: 2013-05-20 | Discharge: 2013-05-20 | Disposition: A | Discharge status: Home / Self Care | Payer: Medicare Other | Source: Ambulatory Visit | Attending: General Surgery | Admitting: General Surgery

## 2013-05-20 DIAGNOSIS — K449 Diaphragmatic hernia without obstruction or gangrene: Secondary | ICD-10-CM

## 2013-05-20 MED ORDER — IOHEXOL 300 MG/ML  SOLN
100.0000 mL | Freq: Once | INTRAMUSCULAR | Status: AC | PRN
  Administered 2013-05-20: 100 mL via INTRAVENOUS

## 2013-05-26 ENCOUNTER — Telehealth (INDEPENDENT_AMBULATORY_CARE_PROVIDER_SITE_OTHER): Payer: Self-pay

## 2013-05-26 ENCOUNTER — Other Ambulatory Visit (INDEPENDENT_AMBULATORY_CARE_PROVIDER_SITE_OTHER): Payer: Self-pay

## 2013-05-26 DIAGNOSIS — K449 Diaphragmatic hernia without obstruction or gangrene: Secondary | ICD-10-CM

## 2013-05-26 NOTE — Telephone Encounter (Signed)
Called and spoke to patient with CT results given.  Patient made aware that we are referring to see Dr. Leone PayorGessner @ [facility] for EGD.

## 2013-05-26 NOTE — Telephone Encounter (Signed)
Message copied by Maryan PulsMOORE, Doyal Saric on Thu May 26, 2013  5:10 PM ------      Message from: [facility], SUBERINA      Created: Wed May 25, 2013  3:48 PM      Contact: [phone]       He was here on 05/17/13 and then he was sent to have a ct scan and he was just asking for the results please call him ------

## 2013-05-30 ENCOUNTER — Telehealth (INDEPENDENT_AMBULATORY_CARE_PROVIDER_SITE_OTHER): Payer: Self-pay | Admitting: *Deleted

## 2013-05-30 ENCOUNTER — Encounter: Payer: Self-pay | Admitting: Internal Medicine

## 2013-05-30 NOTE — Telephone Encounter (Signed)
I spoke with pt and informed him of his appt with Dr. Leone PayorGessner at [facility] on 06/29/13 with an arrival time of 2:00pm.  I provided him with their phone number and address.  He is going to call periodically to see if there are any cancellations.  He is agreeable with this appt at this time.

## 2013-06-29 ENCOUNTER — Encounter: Payer: Self-pay | Admitting: Internal Medicine

## 2013-06-29 ENCOUNTER — Ambulatory Visit (INDEPENDENT_AMBULATORY_CARE_PROVIDER_SITE_OTHER): Payer: Medicare Other | Admitting: Internal Medicine

## 2013-06-29 VITALS — BP 124/70 | HR 82 | Ht 67.5 in | Wt 189.3 lb

## 2013-06-29 DIAGNOSIS — K449 Diaphragmatic hernia without obstruction or gangrene: Secondary | ICD-10-CM

## 2013-06-29 DIAGNOSIS — R079 Chest pain, unspecified: Secondary | ICD-10-CM

## 2013-06-29 NOTE — Progress Notes (Signed)
         Subjective:    Patient ID: Troy Parker, male    DOB: 10/10/1935, 78 y.o.   MRN: [id number]  HPI The patient is an elderly wm with a paraesophageal hernia that has been causing intermittent chest pain and some dysphagia. He was seen when hospitalized in Dec and had [facility] evaluation as an outpatient by Dr. Derrell Lollingamirez. Dr. Derrell Lollingamirez had him do a CT scan which did not show any new problems and notes indicate that Dr. Derrell Lollingamirez is apparently wanting an EGD to assess further.  Medications, allergies, past medical history, past surgical history, family history and social history are reviewed and updated in the EMR.    Review of Systems As above and anxiety and panic issues better now    Objective:   Physical Exam General:  NAD Eyes:   anicteric Lungs:  clear Heart:  S1S2 no rubs, murmurs or gallops   Data Reviewed:  Hospital notes, xray images and reports    Assessment & Plan:  Paraesophageal hernia - Plan: Ambulatory referral to Gastroenterology  Chest pain - Plan: Ambulatory referral to Gastroenterology  Will schedule for EGD. This will be a pre-op exam as he needs surgical repair unless there are contraindications - doubt.  Cc: Dr. Rhett Bannisteramirez KELLY,SAM, MD

## 2013-06-29 NOTE — Patient Instructions (Addendum)
You have been scheduled for an endoscopy with propofol. Please follow written instructions given to you at your visit today. If you use inhalers (even only as needed), please bring them with you on the day of your procedure.  Thank you for choosing [facility] for your health care needs!

## 2013-07-04 ENCOUNTER — Telehealth: Payer: Self-pay

## 2013-07-04 NOTE — Telephone Encounter (Signed)
Spoke to patient that we have changed his 07/07/13 appointment time from 12 noon to 8:30am.  He said that will be fine.  I told him how to adjust his times for prepping.

## 2013-07-07 ENCOUNTER — Ambulatory Visit (AMBULATORY_SURGERY_CENTER): Payer: Medicare Other | Admitting: Internal Medicine

## 2013-07-07 ENCOUNTER — Encounter: Payer: Self-pay | Admitting: Internal Medicine

## 2013-07-07 ENCOUNTER — Encounter: Payer: Medicare Other | Admitting: Internal Medicine

## 2013-07-07 VITALS — BP 109/56 | HR 53 | Temp 97.4°F | Resp 31 | Ht 67.5 in | Wt 189.0 lb

## 2013-07-07 DIAGNOSIS — R079 Chest pain, unspecified: Secondary | ICD-10-CM

## 2013-07-07 DIAGNOSIS — K449 Diaphragmatic hernia without obstruction or gangrene: Secondary | ICD-10-CM

## 2013-07-07 MED ORDER — SODIUM CHLORIDE 0.9 % IV SOLN: 500.0000 mL | INTRAVENOUS | Status: DC

## 2013-07-07 NOTE — Progress Notes (Signed)
A/ox3 pleased with MAC, report to Suzanne RN 

## 2013-07-07 NOTE — Patient Instructions (Addendum)
I saw the hiatal hernia - no other problems.  Please call Dr. Derrell Lollingamirez for an appointment to see what is next.  I appreciate the opportunity to care for you. Iva Booparl E. Adaliah Hiegel, MD, FACG YOU HAD AN ENDOSCOPIC PROCEDURE TODAY AT THE [facility]: Refer to the procedure report that was given to you for any specific questions about what was found during the examination.  If the procedure report does not answer your questions, please call your gastroenterologist to clarify.  If you requested that your care partner not be given the details of your procedure findings, then the procedure report has been included in a sealed envelope for you to review at your convenience later.  YOU SHOULD EXPECT: Some feelings of bloating in the abdomen. Passage of more gas than usual.  Walking can help get rid of the air that was put into your GI tract during the procedure and reduce the bloating. If you had a lower endoscopy (such as a colonoscopy or flexible sigmoidoscopy) you may notice spotting of blood in your stool or on the toilet paper. If you underwent a bowel prep for your procedure, then you may not have a normal bowel movement for a few days.  DIET: Your first meal following the procedure should be a light meal and then it is ok to progress to your normal diet.  A half-sandwich or bowl of soup is an example of a good first meal.  Heavy or fried foods are harder to digest and may make you feel nauseous or bloated.  Likewise meals heavy in dairy and vegetables can cause extra gas to form and this can also increase the bloating.  Drink plenty of fluids but you should avoid alcoholic beverages for 24 hours.  ACTIVITY: Your care partner should take you home directly after the procedure.  You should plan to take it easy, moving slowly for the rest of the day.  You can resume normal activity the day after the procedure however you should NOT DRIVE or use heavy machinery for 24 hours (because of the sedation  medicines used during the test).    SYMPTOMS TO REPORT IMMEDIATELY: A gastroenterologist can be reached at any hour.  During normal business hours, 8:30 AM to 5:00 PM Monday through Friday, call [phone].  After hours and on weekends, please call the GI answering service at [phone] who will take a message and have the physician on call contact you.   Following upper endoscopy (EGD)  Vomiting of blood or coffee ground material  New chest pain or pain under the shoulder blades  Painful or persistently difficult swallowing  New shortness of breath  Fever of 100F or higher  Black, tarry-looking stools  FOLLOW UP: If any biopsies were taken you will be contacted by phone or by letter within the next 1-3 weeks.  Call your gastroenterologist if you have not heard about the biopsies in 3 weeks.  Our staff will call the home number listed on your records the next business day following your procedure to check on you and address any questions or concerns that you may have at that time regarding the information given to you following your procedure. This is a courtesy call and so if there is no answer at the home number and we have not heard from you through the emergency physician on call, we will assume that you have returned to your regular daily activities without incident.  SIGNATURES/CONFIDENTIALITY: You and/or your care partner have signed  paperwork which will be entered into your electronic medical record.  These signatures attest to the fact that that the information above on your After Visit Summary has been reviewed and is understood.  Full responsibility of the confidentiality of this discharge information lies with you and/or your care-partner.

## 2013-07-07 NOTE — Op Note (Signed)
[facility] [address] [place], [id number]   ENDOSCOPY PROCEDURE REPORT  PATIENT: Troy Parker, [name]  MR#: [id number] BIRTHDATE: 1935-08-31 , 77  yrs. old GENDER: Male ENDOSCOPIST: Iva Booparl E Sima Lindenberger, MD, [facility] PROCEDURE DATE:  07/07/2013 PROCEDURE:  EGD, diagnostic ASA CLASS:     Class III INDICATIONS:  Chest pain.  paraesophageal hiatal hernia - pre-op evaluation MEDICATIONS: Propofol (Diprivan) 70 mg IV, MAC sedation, administered by CRNA, and These medications were titrated to patient response per physician's verbal order TOPICAL ANESTHETIC: none  DESCRIPTION OF PROCEDURE: After the risks benefits and alternatives of the procedure were thoroughly explained, informed consent was obtained.  The [facility] [id number] endoscope was introduced through the mouth and advanced to the second portion of the duodenum. Without limitations.  The instrument was slowly withdrawn as the mucosa was fully examined.        STOMACH: A 9 cm hiatal hernia was noted. 31-40 cm from incisors  No clear paraesophageal component seen today.  The remainder of the upper endoscopy exam was otherwise normal. Retroflexed views revealed a hiatal hernia.     The scope was then withdrawn from the patient and the procedure completed.  COMPLICATIONS: There were no complications. ENDOSCOPIC IMPRESSION: 1.   9 cm hiatal hernia 2.   The remainder of the upper endoscopy exam was otherwise normal  RECOMMENDATIONS: Call to make an follow-up appointment with Dr.  Derrell Lollingamirez (surgeon)   eSigned:  Iva Booparl E Thania Woodlief, MD, [facility]ACG 07/07/2013 8:41 AM   EX:BMWUXLCC:Samuel Tresa EndoKelly, MD, Axel FillerArmando Ramirez, MD and The Patient

## 2013-07-08 ENCOUNTER — Telehealth: Payer: Self-pay | Admitting: *Deleted

## 2013-07-08 NOTE — Telephone Encounter (Signed)
  Follow up Call-  Call back number 07/07/2013  Post procedure Call Back phone  # [phone]  Permission to leave phone message Yes     Patient questions:  Do you have a fever, pain , or abdominal swelling? no Pain Score  0 *  Have you tolerated food without any problems? yes  Have you been able to return to your normal activities? yes  Do you have any questions about your discharge instructions: Diet   no Medications  no Follow up visit  no  Do you have questions or concerns about your Care? no  Actions: * If pain score is 4 or above: No action needed, pain <4.

## 2013-07-18 ENCOUNTER — Encounter (INDEPENDENT_AMBULATORY_CARE_PROVIDER_SITE_OTHER): Payer: Self-pay | Admitting: General Surgery

## 2013-07-18 ENCOUNTER — Ambulatory Visit (INDEPENDENT_AMBULATORY_CARE_PROVIDER_SITE_OTHER): Payer: Medicare Other | Admitting: General Surgery

## 2013-07-18 VITALS — BP 130/78 | HR 80 | Temp 99.9°F | Resp 14 | Ht 67.0 in | Wt 191.0 lb

## 2013-07-18 DIAGNOSIS — K449 Diaphragmatic hernia without obstruction or gangrene: Secondary | ICD-10-CM

## 2013-07-18 NOTE — Progress Notes (Signed)
Patient ID: Troy Parker, male   DOB: Feb 10, 1936, 78 y.o.   MRN: [id number]  Chief Complaint  Patient presents with  . New Evaluation    discuss surgery had CT and endoscopy-hernia    HPI Troy Parker is a 78 y.o. male.  The patient is a 78 year old male who was previously seen and worked up for a hiatal hernia. Patient undergo an EGD which revealed a 9 cm hiatal hernia otherwise normal EGD. The patient's undergone a previous CT scan which reveals a larger thoracic portion of the stomach.  The patient states he still had abdominal pain at this time. He states he has minimal reflux as noted. He does state he has some shortness of breath at times.  HPI  Past Medical History  Diagnosis Date  . Glaucoma   . Hypercholesterolemia   . BPH (benign prostatic hyperplasia)   . Nephrolithiasis     a. remote surgical removal.  . Chest pain     a. h/o nl cath in his 30's - [place].  Marland Kitchen Resting tremor     a. first noticed 03/2013 - left hand and chin/mouth.  . Paraesophageal hiatal hernia     a. noted on cxr 04/2013. b. confirmed on UGI series 04/2013, to f/u with surgery as an OP  . Anxiety     Past Surgical History  Procedure Laterality Date  . Eye surgery  04/11/13    cataract removal R eye  . Kidney stone surgery    . Back surgery    . Foot surgery      x2    Family History  Problem Relation Age of Onset  . CAD Mother     h/o cabg and redo cabg - died in her 76's  . CAD Father     h/o cabg - died in his 75's  . CAD Brother     s/p cabg  . CAD Brother     s/p cabg  . Stroke Brother   . Other Brother     pts twin -     Social History History  Substance Use Topics  . Smoking status: Former Games developer  . Smokeless tobacco: Never Used  . Alcohol Use: No    No Known Allergies  Current Outpatient Prescriptions  Medication Sig Dispense Refill  . terazosin (HYTRIN) 10 MG capsule Take 10 mg by mouth at bedtime.      . ALPRAZolam (XANAX) 0.25 MG tablet Take 1 tablet (0.25 mg  total) by mouth every 12 (twelve) hours as needed for anxiety.  15 tablet  0  . aspirin EC 81 MG tablet Take 81 mg by mouth daily.      Marland Kitchen escitalopram (LEXAPRO) 20 MG tablet Take 20 mg by mouth daily.      . Omega-3 Fatty Acids (FISH OIL PO) Take 1 capsule by mouth at bedtime.       . rosuvastatin (CRESTOR) 20 MG tablet Take 20 mg by mouth at bedtime.      . travoprost, benzalkonium, (TRAVATAN) 0.004 % ophthalmic solution Place 1 drop into both eyes at bedtime.       No current facility-administered medications for this visit.    Review of Systems Review of Systems  Constitutional: Negative.   HENT: Negative.   Eyes: Negative.   Respiratory: Negative.   Cardiovascular: Negative.   Gastrointestinal: Positive for abdominal pain.  Endocrine: Negative.   Neurological: Negative.     Blood pressure 130/78, pulse 80, temperature 99.9 F (37.7  C), temperature source Oral, resp. rate 14, height 5\' 7"  (1.702 m), weight 191 lb (86.637 kg).  Physical Exam Physical Exam  Constitutional: He is oriented to person, place, and time. He appears well-developed and well-nourished.  HENT:  Head: Normocephalic and atraumatic.  Eyes: Conjunctivae and EOM are normal. Pupils are equal, round, and reactive to light.  Neck: Normal range of motion. Neck supple.  Cardiovascular: Normal rate, regular rhythm and normal heart sounds.   Pulmonary/Chest: Effort normal and breath sounds normal.  Abdominal: Soft. Bowel sounds are normal. He exhibits no distension and no mass. There is no tenderness. There is no rebound and no guarding.  Musculoskeletal: Normal range of motion.  Neurological: He is alert and oriented to person, place, and time.  Skin: Skin is warm and dry.    Data Reviewed As above  Assessment    78 year old male with hiatal hernia     Plan    1. We'll proceed to the operating room for laparoscopic hiatal hernia repair Nissen fundoplication. 2. I discussed with the patient the risks  and benefits of the procedure to include but not limited to: Damage to surrounding structures, possible esophageal leak, possible need for further operations and procedures, possible infection, possible bleeding, possible need for postoperative esophageal dilations. The patient voiced understanding and wished to proceed.        Marigene Ehlersamirez Jr., Cherae Marton 07/18/2013, 11:09 AM

## 2013-07-28 ENCOUNTER — Encounter (HOSPITAL_COMMUNITY): Payer: Self-pay | Admitting: Pharmacy Technician

## 2013-08-04 ENCOUNTER — Encounter (HOSPITAL_COMMUNITY): Payer: Self-pay

## 2013-08-04 ENCOUNTER — Encounter (HOSPITAL_COMMUNITY)
Admission: RE | Admit: 2013-08-04 | Discharge: 2013-08-04 | Disposition: A | Discharge status: Home / Self Care | Payer: Medicare Other | Source: Ambulatory Visit | Attending: General Surgery | Admitting: General Surgery

## 2013-08-04 DIAGNOSIS — IMO0001 Reserved for inherently not codable concepts without codable children: Secondary | ICD-10-CM

## 2013-08-04 DIAGNOSIS — Z01812 Encounter for preprocedural laboratory examination: Secondary | ICD-10-CM | POA: Insufficient documentation

## 2013-08-04 DIAGNOSIS — Z531 Procedure and treatment not carried out because of patient's decision for reasons of belief and group pressure: Secondary | ICD-10-CM

## 2013-08-04 DIAGNOSIS — H269 Unspecified cataract: Secondary | ICD-10-CM

## 2013-08-04 HISTORY — DX: Procedure and treatment not carried out because of patient's decision for reasons of belief and group pressure: Z53.1

## 2013-08-04 HISTORY — DX: Unspecified cataract: H26.9

## 2013-08-04 HISTORY — DX: Rosacea, unspecified: L71.9

## 2013-08-04 HISTORY — DX: Reserved for inherently not codable concepts without codable children: IMO0001

## 2013-08-04 LAB — COMPREHENSIVE METABOLIC PANEL
ALK PHOS: 49 U/L (ref 39–117)
ALT: 15 U/L (ref 0–53)
AST: 20 U/L (ref 0–37)
Albumin: 4.1 g/dL (ref 3.5–5.2)
BUN: 20 mg/dL (ref 6–23)
CO2: 25 mEq/L (ref 19–32)
Calcium: 9.6 mg/dL (ref 8.4–10.5)
Chloride: 103 mEq/L (ref 96–112)
Creatinine, Ser: 1.29 mg/dL (ref 0.50–1.35)
GFR calc Af Amer: 61 mL/min — ABNORMAL LOW (ref >90)
GFR calc non Af Amer: 53 mL/min — ABNORMAL LOW (ref >90)
Glucose, Bld: 95 mg/dL (ref 70–99)
POTASSIUM: 4.9 meq/L (ref 3.7–5.3)
SODIUM: 140 meq/L (ref 137–147)
TOTAL PROTEIN: 7.6 g/dL (ref 6.0–8.3)
Total Bilirubin: 0.7 mg/dL (ref 0.3–1.2)

## 2013-08-04 LAB — NO BLOOD PRODUCTS

## 2013-08-04 LAB — CBC
HCT: 44.3 % (ref 39.0–52.0)
Hemoglobin: 14.9 g/dL (ref 13.0–17.0)
MCH: 29.9 pg (ref 26.0–34.0)
MCHC: 33.6 g/dL (ref 30.0–36.0)
MCV: 89 fL (ref 78.0–100.0)
Platelets: 184 10*3/uL (ref 150–400)
RBC: 4.98 MIL/uL (ref 4.22–5.81)
RDW: 13 % (ref 11.5–15.5)
WBC: 5.6 10*3/uL (ref 4.0–10.5)

## 2013-08-04 NOTE — Patient Instructions (Addendum)
20 Steward RosJerry G Grealish  08/04/2013   Your procedure is scheduled on:   08-09-2013  Report to [facility] at    0530    AM.  Call this number if you have problems the morning of surgery: [phone]  Or Presurgical Testing [phone](Ah Bott) For Living Will and/or Health Care Power Attorney Forms: please provide copy for your medical record,may bring AM of surgery(Forms should be already notarized -we do not provide this service).(4-2- 15 Prefers no further information today).  Remember: Follow any bowel prep instructions per MD office.    Do not eat food:After Midnight.    Take these medicines the morning of surgery with A SIP OF WATER: none. Bring eye drops/use as usual.   Do not wear jewelry, make-up or nail polish.  Do not wear lotions, powders, or perfumes. You may wear deodorant.  Do not shave 48 hours(2 days) prior to first CHG shower(legs and under arms).(Shaving face and neck okay.)  Do not bring valuables to the hospital.(Hospital is not responsible for lost valuables).  Contacts, dentures or removable bridgework, body piercing, hair pins may not be worn into surgery.  Leave suitcase in the car. After surgery it may be brought to your room.  For patients admitted to the hospital, checkout time is 11:00 AM the day of discharge.(Restricted visitors-Any Persons displaying flu-like symptoms or illness).    Patients discharged the day of surgery will not be allowed to drive home. Must have responsible person with you x 24 hours once discharged.  Name and phone number of your driver: Annye RuskJoel Pietrzyk -son [phone] h  Special Instructions: CHG(Chlorhedine 4%-"Hibiclens","Betasept","Aplicare") Shower Use Special Wash: see special instructions.(avoid face and genitals)     Failure to follow these instructions may result in Cancellation of your surgery.   Patient signature_______________________________________________________

## 2013-08-04 NOTE — Pre-Procedure Instructions (Signed)
08-04-13 EKG 1'15, Stess 12'14, CXR 12'14 Epic

## 2013-08-04 NOTE — Progress Notes (Signed)
08-04-13 1600 Pt. Is Jehovah Witness- Refusal to permit blood transfusions signed and with chart.W. Kennon PortelaHendrick,RN

## 2013-08-09 ENCOUNTER — Encounter (HOSPITAL_COMMUNITY): Payer: Self-pay | Admitting: *Deleted

## 2013-08-09 ENCOUNTER — Encounter (HOSPITAL_COMMUNITY): Payer: Medicare Other | Admitting: Anesthesiology

## 2013-08-09 ENCOUNTER — Encounter (HOSPITAL_COMMUNITY)
Admission: RE | Disposition: A | Discharge status: Home / Self Care | Payer: Self-pay | Source: Ambulatory Visit | Attending: General Surgery

## 2013-08-09 ENCOUNTER — Inpatient Hospital Stay (HOSPITAL_COMMUNITY): Payer: Medicare Other | Admitting: Anesthesiology

## 2013-08-09 ENCOUNTER — Inpatient Hospital Stay (HOSPITAL_COMMUNITY)
Admission: RE | Admit: 2013-08-09 | Discharge: 2013-08-11 | DRG: 328 | Disposition: A | Discharge status: Home / Self Care | Payer: Medicare Other | Source: Ambulatory Visit | Attending: General Surgery | Admitting: General Surgery

## 2013-08-09 DIAGNOSIS — E7800 Pure hypercholesterolemia, unspecified: Secondary | ICD-10-CM | POA: Diagnosis present

## 2013-08-09 DIAGNOSIS — H409 Unspecified glaucoma: Secondary | ICD-10-CM | POA: Diagnosis present

## 2013-08-09 DIAGNOSIS — Z87442 Personal history of urinary calculi: Secondary | ICD-10-CM

## 2013-08-09 DIAGNOSIS — R0602 Shortness of breath: Secondary | ICD-10-CM | POA: Diagnosis present

## 2013-08-09 DIAGNOSIS — K449 Diaphragmatic hernia without obstruction or gangrene: Secondary | ICD-10-CM

## 2013-08-09 DIAGNOSIS — Z87891 Personal history of nicotine dependence: Secondary | ICD-10-CM

## 2013-08-09 DIAGNOSIS — R109 Unspecified abdominal pain: Secondary | ICD-10-CM | POA: Diagnosis present

## 2013-08-09 DIAGNOSIS — Z8249 Family history of ischemic heart disease and other diseases of the circulatory system: Secondary | ICD-10-CM

## 2013-08-09 DIAGNOSIS — Z9889 Other specified postprocedural states: Secondary | ICD-10-CM

## 2013-08-09 DIAGNOSIS — Z823 Family history of stroke: Secondary | ICD-10-CM

## 2013-08-09 DIAGNOSIS — Z7982 Long term (current) use of aspirin: Secondary | ICD-10-CM

## 2013-08-09 DIAGNOSIS — F411 Generalized anxiety disorder: Secondary | ICD-10-CM | POA: Diagnosis present

## 2013-08-09 DIAGNOSIS — K219 Gastro-esophageal reflux disease without esophagitis: Secondary | ICD-10-CM | POA: Diagnosis present

## 2013-08-09 DIAGNOSIS — Z79899 Other long term (current) drug therapy: Secondary | ICD-10-CM

## 2013-08-09 DIAGNOSIS — Z9849 Cataract extraction status, unspecified eye: Secondary | ICD-10-CM

## 2013-08-09 HISTORY — PX: INSERTION OF MESH: SHX5868

## 2013-08-09 HISTORY — PX: LAPAROSCOPIC NISSEN FUNDOPLICATION: SHX1932

## 2013-08-09 LAB — CREATININE, SERUM
CREATININE: 1.29 mg/dL (ref 0.50–1.35)
GFR calc Af Amer: 61 mL/min — ABNORMAL LOW (ref >90)
GFR, EST NON AFRICAN AMERICAN: 53 mL/min — AB (ref >90)

## 2013-08-09 LAB — CBC
HEMATOCRIT: 37 % — AB (ref 39.0–52.0)
HEMOGLOBIN: 12.4 g/dL — AB (ref 13.0–17.0)
MCH: 29.9 pg (ref 26.0–34.0)
MCHC: 33.5 g/dL (ref 30.0–36.0)
MCV: 89.2 fL (ref 78.0–100.0)
Platelets: 152 10*3/uL (ref 150–400)
RBC: 4.15 MIL/uL — AB (ref 4.22–5.81)
RDW: 13.1 % (ref 11.5–15.5)
WBC: 9.3 10*3/uL (ref 4.0–10.5)

## 2013-08-09 SURGERY — FUNDOPLICATION, NISSEN, LAPAROSCOPIC
Anesthesia: General | Site: Abdomen

## 2013-08-09 MED ORDER — DEXTROSE-NACL 5-0.9 % IV SOLN
INTRAVENOUS | Status: DC
  Administered 2013-08-09 – 2013-08-11 (×5): via INTRAVENOUS

## 2013-08-09 MED ORDER — ONDANSETRON HCL 4 MG/2ML IJ SOLN
INTRAMUSCULAR | Status: AC
  Filled 2013-08-09: qty 2

## 2013-08-09 MED ORDER — CEFAZOLIN SODIUM-DEXTROSE 2-3 GM-% IV SOLR
2.0000 g | INTRAVENOUS | Status: AC
  Administered 2013-08-09: 2 g via INTRAVENOUS

## 2013-08-09 MED ORDER — CISATRACURIUM BESYLATE 20 MG/10ML IV SOLN
INTRAVENOUS | Status: AC
  Filled 2013-08-09: qty 10

## 2013-08-09 MED ORDER — SUFENTANIL CITRATE 50 MCG/ML IV SOLN: INTRAVENOUS | Status: DC | PRN

## 2013-08-09 MED ORDER — CISATRACURIUM BESYLATE (PF) 10 MG/5ML IV SOLN
INTRAVENOUS | Status: DC | PRN
  Administered 2013-08-09: 4 mg via INTRAVENOUS
  Administered 2013-08-09: 10 mg via INTRAVENOUS
  Administered 2013-08-09: 2 mg via INTRAVENOUS

## 2013-08-09 MED ORDER — LIDOCAINE HCL (CARDIAC) 20 MG/ML IV SOLN
INTRAVENOUS | Status: DC | PRN
  Administered 2013-08-09: 100 mg via INTRAVENOUS

## 2013-08-09 MED ORDER — LACTATED RINGERS IV SOLN: INTRAVENOUS | Status: DC

## 2013-08-09 MED ORDER — LACTATED RINGERS IV SOLN
INTRAVENOUS | Status: DC | PRN
  Administered 2013-08-09 (×2): via INTRAVENOUS

## 2013-08-09 MED ORDER — CHLORHEXIDINE GLUCONATE 4 % EX LIQD: 1.0000 "application " | Freq: Once | CUTANEOUS | Status: DC

## 2013-08-09 MED ORDER — HYDROMORPHONE HCL PF 1 MG/ML IJ SOLN
0.5000 mg | INTRAMUSCULAR | Status: DC | PRN
  Administered 2013-08-09 – 2013-08-11 (×9): 0.5 mg via INTRAVENOUS
  Filled 2013-08-09 (×9): qty 1

## 2013-08-09 MED ORDER — PROPOFOL 10 MG/ML IV BOLUS
INTRAVENOUS | Status: DC | PRN
  Administered 2013-08-09: 150 mg via INTRAVENOUS

## 2013-08-09 MED ORDER — ONDANSETRON HCL 4 MG/2ML IJ SOLN
4.0000 mg | Freq: Four times a day (QID) | INTRAMUSCULAR | Status: DC | PRN
Start: 2013-08-09 — End: 2013-08-11

## 2013-08-09 MED ORDER — BUPIVACAINE-EPINEPHRINE 0.25% -1:200000 IJ SOLN
INTRAMUSCULAR | Status: DC | PRN
  Administered 2013-08-09: 17 mL

## 2013-08-09 MED ORDER — SODIUM CHLORIDE 0.9 % IJ SOLN
INTRAMUSCULAR | Status: AC
  Filled 2013-08-09: qty 10

## 2013-08-09 MED ORDER — NEOSTIGMINE METHYLSULFATE 1 MG/ML IJ SOLN
INTRAMUSCULAR | Status: AC
  Filled 2013-08-09: qty 10

## 2013-08-09 MED ORDER — LATANOPROST 0.005 % OP SOLN
1.0000 [drp] | Freq: Every day | OPHTHALMIC | Status: DC
  Administered 2013-08-09 – 2013-08-10 (×2): 1 [drp] via OPHTHALMIC
  Filled 2013-08-09: qty 2.5

## 2013-08-09 MED ORDER — TRAVOPROST (BAK FREE) 0.004 % OP SOLN
1.0000 [drp] | Freq: Every day | OPHTHALMIC | Status: DC
  Filled 2013-08-09: qty 2.5

## 2013-08-09 MED ORDER — PROMETHAZINE HCL 25 MG/ML IJ SOLN: 6.2500 mg | INTRAMUSCULAR | Status: DC | PRN

## 2013-08-09 MED ORDER — LACTATED RINGERS IR SOLN
Status: DC | PRN
  Administered 2013-08-09: 1000 mL

## 2013-08-09 MED ORDER — GLYCOPYRROLATE 0.2 MG/ML IJ SOLN
INTRAMUSCULAR | Status: AC
  Filled 2013-08-09: qty 3

## 2013-08-09 MED ORDER — SUCCINYLCHOLINE CHLORIDE 20 MG/ML IJ SOLN
INTRAMUSCULAR | Status: DC | PRN
  Administered 2013-08-09: 100 mg via INTRAVENOUS

## 2013-08-09 MED ORDER — MIDAZOLAM HCL 2 MG/2ML IJ SOLN
INTRAMUSCULAR | Status: AC
  Filled 2013-08-09: qty 2

## 2013-08-09 MED ORDER — PANTOPRAZOLE SODIUM 40 MG IV SOLR
40.0000 mg | Freq: Every day | INTRAVENOUS | Status: DC
  Administered 2013-08-09 – 2013-08-10 (×2): 40 mg via INTRAVENOUS
  Filled 2013-08-09 (×3): qty 40

## 2013-08-09 MED ORDER — HYDROMORPHONE HCL PF 1 MG/ML IJ SOLN
0.2500 mg | INTRAMUSCULAR | Status: DC | PRN
  Administered 2013-08-09 (×3): 0.5 mg via INTRAVENOUS

## 2013-08-09 MED ORDER — LORAZEPAM 2 MG/ML IJ SOLN
0.2500 mg | Freq: Four times a day (QID) | INTRAMUSCULAR | Status: DC | PRN
  Filled 2013-08-09: qty 1

## 2013-08-09 MED ORDER — HYDROMORPHONE HCL PF 1 MG/ML IJ SOLN: 0.2500 mg | INTRAMUSCULAR | Status: DC | PRN

## 2013-08-09 MED ORDER — HYDROMORPHONE HCL PF 1 MG/ML IJ SOLN
INTRAMUSCULAR | Status: AC
  Filled 2013-08-09: qty 1

## 2013-08-09 MED ORDER — PROPOFOL 10 MG/ML IV BOLUS
INTRAVENOUS | Status: AC
  Filled 2013-08-09: qty 20

## 2013-08-09 MED ORDER — CEFAZOLIN SODIUM-DEXTROSE 2-3 GM-% IV SOLR
INTRAVENOUS | Status: AC
  Filled 2013-08-09: qty 50

## 2013-08-09 MED ORDER — ONDANSETRON HCL 4 MG/2ML IJ SOLN
INTRAMUSCULAR | Status: DC | PRN
  Administered 2013-08-09: 44 mg via INTRAVENOUS

## 2013-08-09 MED ORDER — GLYCOPYRROLATE 0.2 MG/ML IJ SOLN
INTRAMUSCULAR | Status: DC | PRN
  Administered 2013-08-09: .6 mg via INTRAVENOUS

## 2013-08-09 MED ORDER — SUFENTANIL CITRATE 50 MCG/ML IV SOLN
INTRAVENOUS | Status: AC
  Filled 2013-08-09: qty 1

## 2013-08-09 MED ORDER — NEOSTIGMINE METHYLSULFATE 1 MG/ML IJ SOLN
INTRAMUSCULAR | Status: DC | PRN
  Administered 2013-08-09: 4 mg via INTRAVENOUS

## 2013-08-09 MED ORDER — BUPIVACAINE-EPINEPHRINE 0.25% -1:200000 IJ SOLN
INTRAMUSCULAR | Status: AC
  Filled 2013-08-09: qty 1

## 2013-08-09 MED ORDER — ESCITALOPRAM OXALATE 20 MG PO TABS
20.0000 mg | ORAL_TABLET | Freq: Every day | ORAL | Status: DC
  Administered 2013-08-10: 20 mg via ORAL
  Filled 2013-08-09 (×3): qty 1

## 2013-08-09 MED ORDER — LIDOCAINE HCL (CARDIAC) 20 MG/ML IV SOLN
INTRAVENOUS | Status: AC
  Filled 2013-08-09: qty 5

## 2013-08-09 MED ORDER — ENOXAPARIN SODIUM 40 MG/0.4ML ~~LOC~~ SOLN
40.0000 mg | SUBCUTANEOUS | Status: DC
  Administered 2013-08-10 – 2013-08-11 (×2): 40 mg via SUBCUTANEOUS
  Filled 2013-08-09 (×3): qty 0.4

## 2013-08-09 MED ORDER — SUFENTANIL CITRATE 50 MCG/ML IV SOLN
INTRAVENOUS | Status: DC | PRN
  Administered 2013-08-09 (×3): 10 ug via INTRAVENOUS
  Administered 2013-08-09: 5 ug via INTRAVENOUS
  Administered 2013-08-09: 10 ug via INTRAVENOUS

## 2013-08-09 MED ORDER — MIDAZOLAM HCL 5 MG/5ML IJ SOLN
INTRAMUSCULAR | Status: DC | PRN
  Administered 2013-08-09: 2 mg via INTRAVENOUS

## 2013-08-09 SURGICAL SUPPLY — 55 items
APPLIER CLIP ROT 10 11.4 M/L (STAPLE)
BENZOIN TINCTURE PRP APPL 2/3 (GAUZE/BANDAGES/DRESSINGS) ×3 IMPLANT
CANISTER SUCTION 2500CC (MISCELLANEOUS) IMPLANT
CLAMP ENDO BABCK 10MM (STAPLE) IMPLANT
CLIP APPLIE ROT 10 11.4 M/L (STAPLE) IMPLANT
DECANTER SPIKE VIAL GLASS SM (MISCELLANEOUS) ×3 IMPLANT
DERMABOND ADVANCED (GAUZE/BANDAGES/DRESSINGS)
DERMABOND ADVANCED .7 DNX12 (GAUZE/BANDAGES/DRESSINGS) IMPLANT
DEVICE SUTURE ENDOST 10MM (ENDOMECHANICALS) ×3 IMPLANT
DISSECTOR BLUNT TIP ENDO 5MM (MISCELLANEOUS) ×3 IMPLANT
DRAIN PENROSE 18X1/2 LTX STRL (DRAIN) ×3 IMPLANT
DRAPE LAPAROSCOPIC ABDOMINAL (DRAPES) ×3 IMPLANT
ELECT REM PT RETURN 9FT ADLT (ELECTROSURGICAL) ×3
ELECTRODE REM PT RTRN 9FT ADLT (ELECTROSURGICAL) ×2 IMPLANT
ENDOLOOP SUT PDS II  0 18 (SUTURE) ×1
ENDOLOOP SUT PDS II 0 18 (SUTURE) ×2 IMPLANT
GLOVE BIO SURGEON STRL SZ7.5 (GLOVE) ×9 IMPLANT
GLOVE BIOGEL PI IND STRL 7.0 (GLOVE) ×8 IMPLANT
GLOVE BIOGEL PI INDICATOR 7.0 (GLOVE) ×4
GOWN STRL REUS W/ TWL XL LVL3 (GOWN DISPOSABLE) ×2 IMPLANT
GOWN STRL REUS W/TWL LRG LVL3 (GOWN DISPOSABLE) IMPLANT
GOWN STRL REUS W/TWL XL LVL3 (GOWN DISPOSABLE) ×16 IMPLANT
GRASPER ENDO BABCOCK 10 (MISCELLANEOUS) IMPLANT
GRASPER ENDO BABCOCK 10MM (MISCELLANEOUS)
KIT BASIN OR (CUSTOM PROCEDURE TRAY) ×3 IMPLANT
MESH HERNIA 7X10 (Mesh General) ×3 IMPLANT
NS IRRIG 1000ML POUR BTL (IV SOLUTION) ×3 IMPLANT
PENCIL BUTTON HOLSTER BLD 10FT (ELECTRODE) IMPLANT
RETRACTOR LAPSCP 12X46 CVD (ENDOMECHANICALS) IMPLANT
RTRCTR LAPSCP 12X46 CVD (ENDOMECHANICALS)
SCALPEL HARMONIC ACE (MISCELLANEOUS) ×3 IMPLANT
SCISSORS LAP 5X35 DISP (ENDOMECHANICALS) ×3 IMPLANT
SET IRRIG TUBING LAPAROSCOPIC (IRRIGATION / IRRIGATOR) ×3 IMPLANT
SOLUTION ANTI FOG 6CC (MISCELLANEOUS) ×3 IMPLANT
STAPLER HERNIA 12 8.5 360D (INSTRUMENTS) ×3 IMPLANT
STAPLER VISISTAT 35W (STAPLE) IMPLANT
STRIP CLOSURE SKIN 1/2X4 (GAUZE/BANDAGES/DRESSINGS) IMPLANT
SUT ETHIBOND 0 36 GRN (SUTURE) ×9 IMPLANT
SUT MNCRL AB 4-0 PS2 18 (SUTURE) ×3 IMPLANT
SUT SILK 2 0 SH (SUTURE) ×18 IMPLANT
SUT SURGIDAC NAB ES-9 0 48 120 (SUTURE) IMPLANT
SUT VICRYL 0 TIES 12 18 (SUTURE) IMPLANT
TIP INNERVISION DETACH 40FR (MISCELLANEOUS) IMPLANT
TIP INNERVISION DETACH 50FR (MISCELLANEOUS) IMPLANT
TIP INNERVISION DETACH 56FR (MISCELLANEOUS) IMPLANT
TIPS INNERVISION DETACH 40FR (MISCELLANEOUS)
TOWEL OR 17X26 10 PK STRL BLUE (TOWEL DISPOSABLE) ×3 IMPLANT
TRAY FOLEY CATH 14FRSI W/METER (CATHETERS) ×3 IMPLANT
TRAY LAP CHOLE (CUSTOM PROCEDURE TRAY) ×3 IMPLANT
TROCAR BLADELESS OPT 5 75 (ENDOMECHANICALS) ×6 IMPLANT
TROCAR XCEL 12X100 BLDLESS (ENDOMECHANICALS) ×3 IMPLANT
TROCAR XCEL BLUNT TIP 100MML (ENDOMECHANICALS) IMPLANT
TROCAR XCEL NON-BLD 11X100MML (ENDOMECHANICALS) ×3 IMPLANT
TROCAR XCEL UNIV SLVE 11M 100M (ENDOMECHANICALS) ×6 IMPLANT
TUBING INSUFFLATION 10FT LAP (TUBING) ×3 IMPLANT

## 2013-08-09 NOTE — Progress Notes (Signed)
Telephone order from Gerkin MD for in and out cath as needed if patient is unable to voided, patient due to void by 1600 bladder scanned and amount was 469ml Md notified and order entered Stanford BreedBracey, Tekesha Almgren N RN 08-09-2013 18:19pm

## 2013-08-09 NOTE — Interval H&P Note (Signed)
History and Physical Interval Note:  08/09/2013 7:14 AM  Troy Parker  has presented today for surgery, with the diagnosis of HIATAL HERNIA   The various methods of treatment have been discussed with the patient and family. After consideration of risks, benefits and other options for treatment, the patient has consented to  Procedure(s): LAPAROSCOPIC NISSEN FUNDOPLICATION AND LAPAROSCOPIC HIATAL HERNIA REPAIR  (N/A) INSERTION OF MESH (N/A) as a surgical intervention .  The patient's history has been reviewed, patient examined, no change in status, stable for surgery.  I have reviewed the patient's chart and labs.  Questions were answered to the patient's satisfaction.     Marigene Ehlersamirez Jr., Jed LimerickArmando

## 2013-08-09 NOTE — Op Note (Signed)
08/09/2013  10:10 AM  PATIENT:  Troy Parker  78 y.o. male  PRE-OPERATIVE DIAGNOSIS:  HIATAL HERNIA   POST-OPERATIVE DIAGNOSIS:  HIATAL HERNIA   PROCEDURE:  Procedure(s): LAPAROSCOPIC NISSEN FUNDOPLICATION AND LAPAROSCOPIC HIATAL HERNIA REPAIR  (N/A) INSERTION OF MESH (N/A)  SURGEON:  Surgeon(s) and Role:    * Axel FillerArmando Carilyn Woolston, MD - Primary    * Mariella SaaBenjamin T Hoxworth, MD - Assisting   ANESTHESIA:   local and general  EBL:  15cc  Total I/O In: 1000 [I.V.:1000] Out: 50 [Urine:50]  BLOOD ADMINISTERED:none  DRAINS: none   LOCAL MEDICATIONS USED:  BUPIVICAINE   SPECIMEN:  No Specimen  DISPOSITION OF SPECIMEN:  N/A  COUNTS:  YES  TOURNIQUET:  * No tourniquets in log *  DICTATION: .Dragon Dictation The patient was taken back to the operating room and placed in the lithotomy position with bilateral SCDs in place. A timeout was called and off all facts and antibiotics were confirmed.  A Veress needle technique was used to insufflate the abdomen to 14 mm of mercury. This was done the midclavicular line just lateral to the umbilicus. A 5 mm trocar and camera were then placed intra-abdominally. Injury to any intra-abdominal organs. A 12 mm trocar was then placed in the epigastrium and the midline under direct visualization.  A 5 mm trocar was then placed in the left subcostal margin, epigastrium, and umbilicus under direct visualization. At this time a Ironman liver retractor was then placed to retract the liver.   At this time we began to dissect the hernia sac anteriorly, this was done with a electrocautery. We dissected this circumferentially around the esophagus, and up into the mediastinum. We proceeded to dissect the left portion of the esophagus and hiatal hernia sac. This brought us to the left crus. At this point we retracted the esophagus to the left and dissected the right crus, until the left crus could be seen in the most posterior portion.  We then began to dissect away  the short gastrics on the greater curvature of the stomach. This allowed us to release some tension of the stomach to allow us to dissect the left crus more appropriately.  At this time we continued to dissect the surrounding adventitial tissue around the esophagus cephalad in the mediastinum. This allowed the stomach to lay within the abdomen with undue tension. A Penrose drain was placed around the esophagus to help with retraction of the esophagus and stomach.  At this time we were able to visualize the crus had a large defect. 2-0 Ethibond stitches were then used in interrupted fashion approximately 1 cm apart in a figure-of-eight fashion x2. This approximated the crus without strangulation or narrowing of the esophagus. At this time a piece of Gore Bio-A mesh was cut to shape and placed into the abdomen. This was in placed over the suture repair posterior to the esophagus over the hiatal repair and stapled using the Covedien Universal hernia stapler, with a 4.8 mm load. This allowed the mesh to lay flat against the hiatal repair.  At this time the greater curvature in the cardia portion of the stomach was then brought posterior to the esophagus. A shoeshine maneuver was carried out. Stomach away undue tension posterior to the esophagus. At this time I sutured the Nissen fundoplication using 2-0 silks in interrupted fashion x2. The most superior suture approximated the 2 edges of the stomach by incorporating esophageal stitch, and right crus stitch. A 4 stitches and used to help  gastropexy the Nissen fundoplication by suturing the left portion of the mesentery the left hiatus. This was also with a 2-0 silk.  The Nissen lap approximately 11:00 position.  At this time the area checked for hemostasis which was excellent. The pneumoperitoneum was evacuated all trochars were removed. All trocar sites were then reapproximated using a 4-0 Monocryl in a subcuticular fashion.   The other skin incisions were  covered with Steri-Strips gauze and tape.  The patient tolerated the procedure well was taken to the recovery room in stable condition.    PLAN OF CARE: Admit to inpatient   PATIENT DISPOSITION:  PACU - hemodynamically stable.   Delay start of Pharmacological VTE agent (>24hrs) due to surgical blood loss or risk of bleeding: yes

## 2013-08-09 NOTE — Transfer of Care (Signed)
Immediate Anesthesia Transfer of Care Note  Patient: Troy Parker  Procedure(s) Performed: Procedure(s): LAPAROSCOPIC NISSEN FUNDOPLICATION AND LAPAROSCOPIC HIATAL HERNIA REPAIR  (N/A) INSERTION OF MESH (N/A)  Patient Location: PACU  Anesthesia Type:General  Level of Consciousness: awake, alert  and oriented  Airway & Oxygen Therapy: Patient Spontanous Breathing and Patient connected to face mask oxygen  Post-op Assessment: Report given to PACU RN and Post -op Vital signs reviewed and stable  Post vital signs: Reviewed and stable  Complications: No apparent anesthesia complications

## 2013-08-09 NOTE — Anesthesia Postprocedure Evaluation (Signed)
Anesthesia Post Note  Patient: Troy Parker  Procedure(s) Performed: Procedure(s) (LRB): LAPAROSCOPIC NISSEN FUNDOPLICATION AND LAPAROSCOPIC HIATAL HERNIA REPAIR  (N/A) INSERTION OF MESH (N/A)  Anesthesia type: General  Patient location: PACU  Post pain: Pain level controlled  Post assessment: Post-op Vital signs reviewed  Last Vitals:  Filed Vitals:   08/09/13 1430  BP: 127/75  Pulse: 67  Temp: 36.4 C  Resp: 16    Post vital signs: Reviewed  Level of consciousness: sedated  Complications: No apparent anesthesia complications

## 2013-08-09 NOTE — H&P (View-Only) (Signed)
Patient ID: Troy Parker, male   DOB: Feb 10, 1936, 78 y.o.   MRN: [id number]  Chief Complaint  Patient presents with  . New Evaluation    discuss surgery had CT and endoscopy-hernia    HPI Troy Parker is a 78 y.o. male.  The patient is a 78 year old male who was previously seen and worked up for a hiatal hernia. Patient undergo an EGD which revealed a 9 cm hiatal hernia otherwise normal EGD. The patient's undergone a previous CT scan which reveals a larger thoracic portion of the stomach.  The patient states he still had abdominal pain at this time. He states he has minimal reflux as noted. He does state he has some shortness of breath at times.  HPI  Past Medical History  Diagnosis Date  . Glaucoma   . Hypercholesterolemia   . BPH (benign prostatic hyperplasia)   . Nephrolithiasis     a. remote surgical removal.  . Chest pain     a. h/o nl cath in his 30's - [place].  Troy Parker Resting tremor     a. first noticed 03/2013 - left hand and chin/mouth.  . Paraesophageal hiatal hernia     a. noted on cxr 04/2013. b. confirmed on UGI series 04/2013, to f/u with surgery as an OP  . Anxiety     Past Surgical History  Procedure Laterality Date  . Eye surgery  04/11/13    cataract removal R eye  . Kidney stone surgery    . Back surgery    . Foot surgery      x2    Family History  Problem Relation Age of Onset  . CAD Mother     h/o cabg and redo cabg - died in her 76's  . CAD Father     h/o cabg - died in his 75's  . CAD Brother     s/p cabg  . CAD Brother     s/p cabg  . Stroke Brother   . Other Brother     pts twin -     Social History History  Substance Use Topics  . Smoking status: Former Games developer  . Smokeless tobacco: Never Used  . Alcohol Use: No    No Known Allergies  Current Outpatient Prescriptions  Medication Sig Dispense Refill  . terazosin (HYTRIN) 10 MG capsule Take 10 mg by mouth at bedtime.      . ALPRAZolam (XANAX) 0.25 MG tablet Take 1 tablet (0.25 mg  total) by mouth every 12 (twelve) hours as needed for anxiety.  15 tablet  0  . aspirin EC 81 MG tablet Take 81 mg by mouth daily.      Troy Parker escitalopram (LEXAPRO) 20 MG tablet Take 20 mg by mouth daily.      . Omega-3 Fatty Acids (FISH OIL PO) Take 1 capsule by mouth at bedtime.       . rosuvastatin (CRESTOR) 20 MG tablet Take 20 mg by mouth at bedtime.      . travoprost, benzalkonium, (TRAVATAN) 0.004 % ophthalmic solution Place 1 drop into both eyes at bedtime.       No current facility-administered medications for this visit.    Review of Systems Review of Systems  Constitutional: Negative.   HENT: Negative.   Eyes: Negative.   Respiratory: Negative.   Cardiovascular: Negative.   Gastrointestinal: Positive for abdominal pain.  Endocrine: Negative.   Neurological: Negative.     Blood pressure 130/78, pulse 80, temperature 99.9 F (37.7  C), temperature source Oral, resp. rate 14, height 5\' 7"  (1.702 m), weight 191 lb (86.637 kg).  Physical Exam Physical Exam  Constitutional: He is oriented to person, place, and time. He appears well-developed and well-nourished.  HENT:  Head: Normocephalic and atraumatic.  Eyes: Conjunctivae and EOM are normal. Pupils are equal, round, and reactive to light.  Neck: Normal range of motion. Neck supple.  Cardiovascular: Normal rate, regular rhythm and normal heart sounds.   Pulmonary/Chest: Effort normal and breath sounds normal.  Abdominal: Soft. Bowel sounds are normal. He exhibits no distension and no mass. There is no tenderness. There is no rebound and no guarding.  Musculoskeletal: Normal range of motion.  Neurological: He is alert and oriented to person, place, and time.  Skin: Skin is warm and dry.    Data Reviewed As above  Assessment    78 year old male with hiatal hernia     Plan    1. We'll proceed to the operating room for laparoscopic hiatal hernia repair Nissen fundoplication. 2. I discussed with the patient the risks  and benefits of the procedure to include but not limited to: Damage to surrounding structures, possible esophageal leak, possible need for further operations and procedures, possible infection, possible bleeding, possible need for postoperative esophageal dilations. The patient voiced understanding and wished to proceed.        Troy Ehlersamirez Jr., Ty Buntrock 07/18/2013, 11:09 AM

## 2013-08-09 NOTE — Anesthesia Preprocedure Evaluation (Addendum)
Anesthesia Evaluation  Patient identified by MRN, date of birth, ID band Patient awake    Reviewed: Allergy & Precautions, H&P , NPO status , Patient's Chart, lab work & pertinent test results  Airway Mallampati: III TM Distance: >3 FB Neck ROM: Full    Dental  (+) Teeth Intact, Caps, Dental Advisory Given   Pulmonary neg pulmonary ROS, former smoker,  breath sounds clear to auscultation  Pulmonary exam normal       Cardiovascular negative cardio ROS  Rhythm:Regular Rate:Normal     Neuro/Psych Anxiety negative neurological ROS     GI/Hepatic Neg liver ROS, hiatal hernia, GERD-  ,  Endo/Other  negative endocrine ROS  Renal/GU Renal disease  negative genitourinary   Musculoskeletal negative musculoskeletal ROS (+)   Abdominal   Peds  Hematology negative hematology ROS (+)   Anesthesia Other Findings   Reproductive/Obstetrics                          Anesthesia Physical Anesthesia Plan  ASA: II  Anesthesia Plan: General   Post-op Pain Management:    Induction: Intravenous  Airway Management Planned: Oral ETT  Additional Equipment:   Intra-op Plan:   Post-operative Plan: Extubation in OR  Informed Consent: I have reviewed the patients History and Physical, chart, labs and discussed the procedure including the risks, benefits and alternatives for the proposed anesthesia with the patient or authorized representative who has indicated his/her understanding and acceptance.   Dental advisory given  Plan Discussed with: CRNA  Anesthesia Plan Comments:         Anesthesia Quick Evaluation

## 2013-08-10 ENCOUNTER — Encounter (HOSPITAL_COMMUNITY): Payer: Self-pay | Admitting: General Surgery

## 2013-08-10 ENCOUNTER — Inpatient Hospital Stay (HOSPITAL_COMMUNITY): Payer: Medicare Other

## 2013-08-10 MED ORDER — PRO-STAT SUGAR FREE PO LIQD
30.0000 mL | Freq: Once | ORAL | Status: AC
  Administered 2013-08-10: 30 mL via ORAL
  Filled 2013-08-10: qty 30

## 2013-08-10 MED ORDER — IOHEXOL 300 MG/ML  SOLN
50.0000 mL | Freq: Once | INTRAMUSCULAR | Status: AC | PRN
  Administered 2013-08-10: 50 mL via ORAL

## 2013-08-10 MED ORDER — ALPRAZOLAM 0.25 MG PO TABS
0.2500 mg | ORAL_TABLET | Freq: Four times a day (QID) | ORAL | Status: DC | PRN
  Administered 2013-08-11: 0.25 mg via ORAL
  Filled 2013-08-10: qty 1

## 2013-08-10 MED ORDER — UNJURY CHICKEN SOUP POWDER
2.0000 [oz_av] | Freq: Once | ORAL | Status: AC
  Administered 2013-08-10: 2 [oz_av] via ORAL
  Filled 2013-08-10: qty 27

## 2013-08-10 MED ORDER — TERAZOSIN HCL 5 MG PO CAPS
10.0000 mg | ORAL_CAPSULE | Freq: Every day | ORAL | Status: DC
  Administered 2013-08-10: 10 mg via ORAL
  Filled 2013-08-10 (×2): qty 2

## 2013-08-10 MED ORDER — HYDROCODONE-ACETAMINOPHEN 7.5-325 MG/15ML PO SOLN
15.0000 mL | ORAL | Status: DC | PRN
  Administered 2013-08-10: 15 mL via ORAL
  Filled 2013-08-10 (×2): qty 15

## 2013-08-10 MED ORDER — RESOURCE INSTANT PROTEIN PO PWD PACKET
1.0000 | Freq: Once | ORAL | Status: AC
  Administered 2013-08-10: 6 g via ORAL
  Filled 2013-08-10: qty 227

## 2013-08-10 NOTE — Plan of Care (Signed)
Problem: Food- and Nutrition-Related Knowledge Deficit (NB-1.1) Goal: Nutrition education Formal process to instruct or train a patient/client in a skill or to impart knowledge to help patients/clients voluntarily manage or modify food choices and eating behavior to maintain or improve health. Outcome: Completed/Met Date Met:  08/10/13 RD received consult for post op nissen fundoplication nutrition therapy.    -MD noted pt will be on clear liquid diet for two weeks followed by soft diet. Provided pt and pt's son with "Low Fiber Nutrition Therapy handout", "Anti-Dumping Syndome Gastrectomy Nutrition" booklet, Ensure Smoothie booklet, and Ensure coupons.  -Encouraged pt to comply with clear liquid diet for two weeks, recommended pt consume low sugar/sugar free food items to avoid dumping syndrome. This included foods such as sugar free popsicles, jello, low sugar juices, and broth soups. Ordered protein supplements compliant with a clear liquid diet-Beneprotein, Pro-stat and Unjury Chicken Broth to increase nutrient density of foods available on 2 week post op diet -Discussed low fiber and soft diet food options-Ensure supplements, eggs, yogurt, grits, white breads, chicken/egg/tuna salads for pt to incorporate. Recommended pt be on low fiber diet for 2 weeks with gradual introduction of fiber foods back into diet. Advised pt to eat 6 small meals/day, consume 4 oz of liquids with meals, and for remainder of fluids to be either before or after meals. Relayed importance of pt to sit upright for 30 minutes after meals.   Will follow up with pt on 4/09 to assess protein supplement tolerance Please re-consult as needed  Atlee Abide [place] LDN Clinical Dietitian [phone]

## 2013-08-10 NOTE — Progress Notes (Signed)
1 Day Post-Op  Subjective: Pt required I&O catheter last night x2.  PT abd sore this AM.  Objective: Vital signs in last 24 hours: Temp:  [97.4 F (36.3 C)-99.5 F (37.5 C)] 98.1 F (36.7 C) (04/08 0230) Pulse Rate:  [67-92] 73 (04/08 0230) Resp:  [11-18] 18 (04/08 0230) BP: (92-150)/(57-75) 92/62 mmHg (04/08 0230) SpO2:  [92 %-98 %] 92 % (04/08 0230) Weight:  [188 lb 3.7 oz (85.381 kg)] 188 lb 3.7 oz (85.381 kg) (04/07 1135) Last BM Date: 08/08/13  Intake/Output from previous day: 04/07 0701 - 04/08 0700 In: 2231.7 [I.V.:2231.7] Out: 1150 [Urine:1150] Intake/Output this shift: Total I/O In: 0  Out: 600 [Urine:600]  General appearance: alert and cooperative Resp: clear to auscultation bilaterally Cardio: regular rate and rhythm, S1, S2 normal, no murmur, click, rub or gallop GI: soft, approp TTP, ND, active BS, wounds CDI  Lab Results:   Recent Labs  08/09/13 1304  WBC 9.3  HGB 12.4*  HCT 37.0*  PLT 152   BMET  Recent Labs  08/09/13 1304  CREATININE 1.29   PT/INR No results found for this basename: LABPROT, INR,  in the last 72 hours ABG No results found for this basename: PHART, PCO2, PO2, HCO3,  in the last 72 hours  Studies/Results: No results found.  Anti-infectives: Anti-infectives   Start     Dose/Rate Route Frequency Ordered Stop   08/09/13 0556  ceFAZolin (ANCEF) IVPB 2 g/50 mL premix     2 g 100 mL/hr over 30 Minutes Intravenous On call to O.R. 08/09/13 0556 08/09/13 0735      Assessment/Plan: s/p Procedure(s): LAPAROSCOPIC NISSEN FUNDOPLICATION AND LAPAROSCOPIC HIATAL HERNIA REPAIR  (N/A) INSERTION OF MESH (N/A) Await UGI and esoph to eval s/p Nissen fundoplication If Xray shows no signs of leak, will start Clears thereafter Encourage ambulation   LOS: 1 day    Axel Fillerrmando Dasani Thurlow 08/10/2013

## 2013-08-10 NOTE — Care Management Note (Signed)
    Page 1 of 1   08/10/2013     10:25:28 AM   CARE MANAGEMENT NOTE 08/10/2013  Patient:  Troy Parker,Golden G   Account Number:  [id number]  Date Initiated:  08/10/2013  Documentation initiated by:  Lorenda IshiharaPEELE,Brenton Joines  Subjective/Objective Assessment:   78 yo male admitted s/p nissen fundoplication. PTA lived at home alone.     Action/Plan:   Home when stable   Anticipated DC Date:  08/13/2013   Anticipated DC Plan:  HOME/SELF CARE      DC Planning Services  CM consult      Choice offered to / List presented to:             Status of service:  Completed, signed off Medicare Important Message given?   (If response is "NO", the following Medicare IM given date fields will be blank) Date Medicare IM given:   Date Additional Medicare IM given:    Discharge Disposition:  HOME/SELF CARE  Per UR Regulation:  Reviewed for med. necessity/level of care/duration of stay  If discussed at Long Length of Stay Meetings, dates discussed:    Comments:

## 2013-08-11 MED ORDER — OXYCODONE-ACETAMINOPHEN 5-325 MG PO TABS: 1.0000 | ORAL_TABLET | ORAL | Status: DC | PRN

## 2013-08-11 MED ORDER — UNJURY CHICKEN SOUP POWDER
2.0000 [oz_av] | Freq: Three times a day (TID) | ORAL | Status: DC
  Filled 2013-08-11 (×3): qty 27

## 2013-08-11 NOTE — Discharge Instructions (Signed)
EATING AFTER YOUR ESOPHAGEAL SURGERY °(Stomach Fundoplication, Hiatal Hernia repair, Achalasia surgery, etc) ° °After your esophageal surgery, expect some sticking with swallowing over the next 1-2 months.   ° °If food sticks when you eat, it is called "dysphagia".  This is due to swelling around your esophagus at the wrap & hiatal diaphragm repair.  It will gradually ease off over the next few months.  To help you through this temporary phase, we start you out on a pureed (blenderized) diet. ° °Your first meal in the hospital was thin liquids.  You should have been given a pureed diet by the time you left the hospital.  We ask patients to stay on a pureed diet for the first 2-3 weeks to avoid anything getting "stuck" near your recent surgery.  Don't be alarmed if your ability to swallow doesn't progress according to this plan.  Everyone is different and some diets can advance more or less quickly.   ° ° °Some BASIC RULES to follow are: °· Maintain an upright position whenever eating or drinking. °· Take small bites - just a teaspoon size bite at a time. °· Eat slowly.  It may also help to eat only one food at a time. °· Consider nibbling through smaller, more frequent meals & avoid the urge to eat BIG meals °· Do not push through feelings of fullness, nausea, or bloatedness °· Do not mix solid foods and liquids in the same mouthful °· Try not to "wash foods down" with large gulps of liquids. °Avoid carbonated (bubbly/fizzy) drinks.  Understand that it will be hard to burp and belch at first.  This gradually improves with time.  Expect to be more gassy/flatulent/bloated initially.  Walking will help you work through that.  Maalox/Gas-X can help as well. °· Eat in a relaxed atmosphere & minimize distractions. °· Avoid talking while eating.   °· Do not use straws. °· Following each meal, sit in an upright position (90 degree angle) for 60 to 90 minutes.  Going for a short walk can help as well °· If food does stick,  don't panic.  Try to relax and let the food pass on its own.  Sipping WARM LIQUID such as strong hot black tea can also help slide it down. ° ° °Be gradual in changes & use common sense: ° °-If you easily tolerating a certain "level" of foods, advance to the next level gradually °-If you are having trouble swallowing a particular food, then avoid it.   °-If food is sticking when you advance your diet, go back to thinner previous diet (the lower LEVEL) for 1-2 days. ° °LEVEL 1 = PUREED DIET ° °Do for the first 2 WEEKS AFTER SURGERY ° °-Foods in this group are pureed or blenderized to a smooth, mashed potato-like consistency.  °-If necessary, the pureed foods can keep their shape with the addition of a thickening agent.   °-Meat should be pureed to a smooth, pasty consistency.  Hot broth or gravy may be added to the pureed meat, approximately 1 oz. of liquid per 3 oz. serving of meat. °-CAUTION:  If any foods do not puree into a smooth consistency, swallowing will be more difficult.  (For example, nuts or seeds sometimes do not blend well.) ° °Hot Foods Cold Foods  °Pureed scrambled eggs and cheese Pureed cottage cheese  °Baby cereals Thickened juices and nectars  °Thinned cooked cereals (no lumps) Thickened milk or eggnog  °Pureed French toast or pancakes Ensure  °Mashed   potatoes Ice cream  °Pureed parsley, au gratin, scalloped potatoes, candied sweet potatoes Fruit or Italian ice, sherbet  °Pureed buttered or alfredo noodles Plain yogurt  °Pureed vegetables (no corn or peas) Instant breakfast  °Pureed soups and creamed soups Smooth pudding, mousse, custard  °Pureed scalloped apples Whipped gelatin  °Gravies Sugar, syrup, honey, jelly  °Sauces, cheese, tomato, barbecue, white, creamed Cream  °Any baby food Creamer  °Alcohol in moderation (not beer or champagne) Margarine  °Coffee or tea Mayonnaise  ° Ketchup, mustard  ° Apple sauce  ° °SAMPLE MENU:  PUREED DIET °Breakfast Lunch Dinner  °· Orange juice, 1/2  cup °· Cream of wheat, 1/2 cup · Pineapple juice, 1/2 cup · Pureed turkey, barley soup, 3/4 cup °· Pureed Hawaiian chicken, 3 oz  °· Scrambled eggs, mashed or blended with cheese, 1/2 cup °· Tea or coffee, 1 cup  °· Whole milk, 1 cup  °· Non-dairy creamer, 2 Tbsp. · Mashed potatoes, 1/2 cup °· Pureed cooled broccoli, 1/2 cup °· Apple sauce, 1/2 cup °· Coffee or tea · Mashed potatoes, 1/2 cup °· Pureed spinach, 1/2 cup °· Frozen yogurt, 1/2 cup °· Tea or coffee  ° ° ° ° °LEVEL 2 = SOFT DIET ° °After your first 2 weeks, you can advance to a soft diet.   °Keep on this diet until everything goes down easily. ° °Hot Foods Cold Foods  °White fish Cottage cheese  °Stuffed fish Junior baby fruit  °Baby food meals Semi thickened juices  °Minced soft cooked, scrambled, poached eggs nectars  °Souffle & omelets Ripe mashed bananas  °Cooked cereals Canned fruit, pineapple sauce, milk  °potatoes Milkshake  °Buttered or Alfredo noodles Custard  °Cooked cooled vegetable Puddings, including tapioca  °Sherbet Yogurt  °Vegetable soup or alphabet soup Fruit ice, Italian ice  °Gravies Whipped gelatin  °Sugar, syrup, honey, jelly Junior baby desserts  °Sauces:  Cheese, creamed, barbecue, tomato, white Cream  °Coffee or tea Margarine  ° °SAMPLE MENU:  LEVEL 2 °Breakfast Lunch Dinner  °· Orange juice, 1/2 cup °· Oatmeal, 1/2 cup °· Scrambled eggs with cheese, 1/2 cup °· Decaffeinated tea, 1 cup °· Whole milk, 1 cup °· Non-dairy creamer, 2 Tbsp · Pineapple juice, 1/2 cup °· Minced beef, 3 oz °· Gravy, 2 Tbsp °· Mashed potatoes, 1/2 cup °· Minced fresh broccoli, 1/2 cup °· Applesauce, 1/2 cup °· Coffee, 1 cup · Turkey, barley soup, 3/4 cup °· Minced Hawaiian chicken, 3 oz °· Mashed potatoes, 1/2 cup °· Cooked spinach, 1/2 cup °· Frozen yogurt, 1/2 cup °· Non-dairy creamer, 2 Tbsp  ° ° ° ° °LEVEL 3 = CHOPPED DIET ° °-After all the foods in level 2 (soft diet) are passing through well you should advance up to more chopped foods.  °-It is still  important to cut these foods into small pieces and eat slowly. ° °Hot Foods Cold Foods  °Poultry Cottage cheese  °Chopped Swedish meatballs Yogurt  °Meat salads (ground or flaked meat) Milk  °Flaked fish (tuna) Milkshakes  °Poached or scrambled eggs Soft, cold, dry cereal  °Souffles and omelets Fruit juices or nectars  °Cooked cereals Chopped canned fruit  °Chopped French toast or pancakes Canned fruit cocktail  °Noodles or pasta (no rice) Pudding, mousse, custard  °Cooked vegetables (no frozen peas, corn, or mixed vegetables) Green salad  °Canned small sweet peas Ice cream  °Creamed soup or vegetable soup Fruit ice, Italian ice  °Pureed vegetable soup or alphabet soup Non-dairy creamer  °Ground scalloped   apples Margarine  °Gravies Mayonnaise  °Sauces:  Cheese, creamed, barbecue, tomato, white Ketchup  °Coffee or tea Mustard  ° °SAMPLE MENU:  LEVEL 3 °Breakfast Lunch Dinner  °· Orange juice, 1/2 cup °· Oatmeal, 1/2 cup °· Scrambled eggs with cheese, 1/2 cup °· Decaffeinated tea, 1 cup °· Whole milk, 1 cup °· Non-dairy creamer, 2 Tbsp °· Ketchup, 1 Tbsp °· Margarine, 1 tsp °· Salt, 1/4 tsp °· Sugar, 2 tsp · Pineapple juice, 1/2 cup °· Ground beef, 3 oz °· Gravy, 2 Tbsp °· Mashed potatoes, 1/2 cup °· Cooked spinach, 1/2 cup °· Applesauce, 1/2 cup °· Decaffeinated coffee °· Whole milk °· Non-dairy creamer, 2 Tbsp °· Margarine, 1 tsp °· Salt, 1/4 tsp · Pureed turkey, barley soup, 3/4 cup °· Barbecue chicken, 3 oz °· Mashed potatoes, 1/2 cup °· Ground fresh broccoli, 1/2 cup °· Frozen yogurt, 1/2 cup °· Decaffeinated tea, 1 cup °· Non-dairy creamer, 2 Tbsp °· Margarine, 1 tsp °· Salt, 1/4 tsp °· Sugar, 1 tsp  ° ° °LEVEL 4:  REGULAR FOODS ° °-Foods in this group are soft, moist, regularly textured foods.   °-This level includes meat and breads, which tend to be the hardest things to swallow.   °-Eat very slowly, chew well and continue to avoid carbonated drinks. °-most people are at this level in 4-6 weeks ° °Hot Foods  Cold Foods  °Baked fish or skinned Soft cheeses - cottage cheese  °Souffles and omelets Cream cheese  °Eggs Yogurt  °Stuffed shells Milk  °Spaghetti with meat sauce Milkshakes  °Cooked cereal Cold dry cereals (no nuts, dried fruit, coconut)  °French toast or pancakes Crackers  °Buttered toast Fruit juices or nectars  °Noodles or pasta (no rice) Canned fruit  °Potatoes (all types) Ripe bananas  °Soft, cooked vegetables (no corn, lima, or baked beans) Peeled, ripe, fresh fruit  °Creamed soups or vegetable soup Cakes (no nuts, dried fruit, coconut)  °Canned chicken noodle soup Plain doughnuts  °Gravies Ice cream  °Bacon dressing Pudding, mousse, custard  °Sauces:  Cheese, creamed, barbecue, tomato, white Fruit ice, Italian ice, sherbet  °Decaffeinated tea or coffee Whipped gelatin  °Pork chops Regular gelatin  ° Canned fruited gelatin molds  ° Sugar, syrup, honey, jam, jelly  ° Cream  ° Non-dairy  ° Margarine  ° Oil  ° Mayonnaise  ° Ketchup  ° Mustard  ° ° °If you have any questions please call our office at [facility]: [phone]. ° °

## 2013-08-11 NOTE — Progress Notes (Signed)
During the night pt was given liquid Vicodin and stated that it did not help his pain. He had to be given IV dilaudid.

## 2013-08-11 NOTE — Discharge Summary (Signed)
Physician Discharge Summary  Patient ID: Troy Parker MRN: [id number] DOB/AGE: 76/01/1936 76 y.o.  Admit date: 08/09/2013 Discharge date: 08/11/2013  Admission Diagnoses: hiatal hernia and GERD  Discharge Diagnoses:  Active Problems:   S/P Nissen fundoplication (without gastrostomy tube) procedure   Discharged Condition: good  Hospital Course: PT is a 78 y/o M status post laparoscopic Nissen fundoplication and hernia repair. The patient postoperatively was sent to the floor. Postop day #1 the patient underwent a Gastrografin study to evaluate for possible leak. This was normal and no leak was seen. He was started on a clear liquid diet. The patient was ambulating on postop day #1 and had fair pain control. The patient also is tolerating a clear liquid diet.  On postop day #2 the patient was otherwise afebrile, tolerating clears, had good pain control with deemed stable for discharge and discharged home.  Pt to crush all oral Rxs nad take with juice  Consults: None  Significant Diagnostic Studies: radiology: upper GI revealed no leak of the esophagus.  Treatments: surgery: as above  Discharge Exam: Blood pressure 113/79, pulse 61, temperature 98.6 F (37 C), temperature source Oral, resp. rate 18, height 5\' 7"  (1.702 m), weight 188 lb 3.7 oz (85.381 kg), SpO2 90.00%. General appearance: alert and cooperative Resp: clear to auscultation bilaterally Cardio: regular rate and rhythm, S1, S2 normal, no murmur, click, rub or gallop GI: soft, non-tender; bowel sounds normal; no masses,  no organomegaly  Disposition: 01-Home or Self Care     Medication List    ASK your doctor about these medications       ALPRAZolam 0.25 MG tablet  Commonly known as:  XANAX  Take 1 tablet (0.25 mg total) by mouth every 12 (twelve) hours as needed for anxiety.     aspirin EC 81 MG tablet  Take 81 mg by mouth daily.     escitalopram 20 MG tablet  Commonly known as:  LEXAPRO  Take 20 mg by  mouth at bedtime.     FISH OIL PO  Take 1 capsule by mouth at bedtime.     metroNIDAZOLE 0.75 % gel  Commonly known as:  METROGEL  Apply 1 application topically 2 (two) times daily.     rosuvastatin 20 MG tablet  Commonly known as:  CRESTOR  Take 20 mg by mouth at bedtime.     terazosin 10 MG capsule  Commonly known as:  HYTRIN  Take 10 mg by mouth at bedtime.     travoprost (benzalkonium) 0.004 % ophthalmic solution  Commonly known as:  TRAVATAN  Place 1 drop into both eyes at bedtime.         Signed: Axel Fillerrmando [name] 08/11/2013, 11:28 AM

## 2013-08-16 ENCOUNTER — Encounter (INDEPENDENT_AMBULATORY_CARE_PROVIDER_SITE_OTHER): Payer: Medicare Other | Admitting: General Surgery

## 2013-08-25 ENCOUNTER — Encounter (INDEPENDENT_AMBULATORY_CARE_PROVIDER_SITE_OTHER): Payer: Self-pay | Admitting: General Surgery

## 2013-08-25 ENCOUNTER — Ambulatory Visit (INDEPENDENT_AMBULATORY_CARE_PROVIDER_SITE_OTHER): Payer: Medicare Other | Admitting: General Surgery

## 2013-08-25 VITALS — BP 120/65 | HR 65 | Temp 98.0°F | Resp 16 | Ht 67.5 in | Wt 172.4 lb

## 2013-08-25 DIAGNOSIS — Z09 Encounter for follow-up examination after completed treatment for conditions other than malignant neoplasm: Secondary | ICD-10-CM

## 2013-08-25 DIAGNOSIS — Z9889 Other specified postprocedural states: Secondary | ICD-10-CM

## 2013-08-25 NOTE — Patient Instructions (Signed)
EATING AFTER YOUR ESOPHAGEAL SURGERY °(Stomach Fundoplication, Hiatal Hernia repair, Achalasia surgery, etc) ° °After your esophageal surgery, expect some sticking with swallowing over the next 1-2 months.   ° °If food sticks when you eat, it is called "dysphagia".  This is due to swelling around your esophagus at the wrap & hiatal diaphragm repair.  It will gradually ease off over the next few months.  To help you through this temporary phase, we start you out on a pureed (blenderized) diet. ° °Your first meal in the hospital was thin liquids.  You should have been given a pureed diet by the time you left the hospital.  We ask patients to stay on a pureed diet for the first 2-3 weeks to avoid anything getting "stuck" near your recent surgery.  Don't be alarmed if your ability to swallow doesn't progress according to this plan.  Everyone is different and some diets can advance more or less quickly.   ° ° °Some BASIC RULES to follow are: °· Maintain an upright position whenever eating or drinking. °· Take small bites - just a teaspoon size bite at a time. °· Eat slowly.  It may also help to eat only one food at a time. °· Consider nibbling through smaller, more frequent meals & avoid the urge to eat BIG meals °· Do not push through feelings of fullness, nausea, or bloatedness °· Do not mix solid foods and liquids in the same mouthful °· Try not to "wash foods down" with large gulps of liquids. °Avoid carbonated (bubbly/fizzy) drinks.  Understand that it will be hard to burp and belch at first.  This gradually improves with time.  Expect to be more gassy/flatulent/bloated initially.  Walking will help you work through that.  Maalox/Gas-X can help as well. °· Eat in a relaxed atmosphere & minimize distractions. °· Avoid talking while eating.   °· Do not use straws. °· Following each meal, sit in an upright position (90 degree angle) for 60 to 90 minutes.  Going for a short walk can help as well °· If food does stick,  don't panic.  Try to relax and let the food pass on its own.  Sipping WARM LIQUID such as strong hot black tea can also help slide it down. ° ° °Be gradual in changes & use common sense: ° °-If you easily tolerating a certain "level" of foods, advance to the next level gradually °-If you are having trouble swallowing a particular food, then avoid it.   °-If food is sticking when you advance your diet, go back to thinner previous diet (the lower LEVEL) for 1-2 days. ° °LEVEL 1 = PUREED DIET ° °Do for the first 2 WEEKS AFTER SURGERY ° °-Foods in this group are pureed or blenderized to a smooth, mashed potato-like consistency.  °-If necessary, the pureed foods can keep their shape with the addition of a thickening agent.   °-Meat should be pureed to a smooth, pasty consistency.  Hot broth or gravy may be added to the pureed meat, approximately 1 oz. of liquid per 3 oz. serving of meat. °-CAUTION:  If any foods do not puree into a smooth consistency, swallowing will be more difficult.  (For example, nuts or seeds sometimes do not blend well.) ° °Hot Foods Cold Foods  °Pureed scrambled eggs and cheese Pureed cottage cheese  °Baby cereals Thickened juices and nectars  °Thinned cooked cereals (no lumps) Thickened milk or eggnog  °Pureed French toast or pancakes Ensure  °Mashed   potatoes Ice cream  °Pureed parsley, au gratin, scalloped potatoes, candied sweet potatoes Fruit or Italian ice, sherbet  °Pureed buttered or alfredo noodles Plain yogurt  °Pureed vegetables (no corn or peas) Instant breakfast  °Pureed soups and creamed soups Smooth pudding, mousse, custard  °Pureed scalloped apples Whipped gelatin  °Gravies Sugar, syrup, honey, jelly  °Sauces, cheese, tomato, barbecue, white, creamed Cream  °Any baby food Creamer  °Alcohol in moderation (not beer or champagne) Margarine  °Coffee or tea Mayonnaise  ° Ketchup, mustard  ° Apple sauce  ° °SAMPLE MENU:  PUREED DIET °Breakfast Lunch Dinner  °· Orange juice, 1/2  cup °· Cream of wheat, 1/2 cup · Pineapple juice, 1/2 cup · Pureed turkey, barley soup, 3/4 cup °· Pureed Hawaiian chicken, 3 oz  °· Scrambled eggs, mashed or blended with cheese, 1/2 cup °· Tea or coffee, 1 cup  °· Whole milk, 1 cup  °· Non-dairy creamer, 2 Tbsp. · Mashed potatoes, 1/2 cup °· Pureed cooled broccoli, 1/2 cup °· Apple sauce, 1/2 cup °· Coffee or tea · Mashed potatoes, 1/2 cup °· Pureed spinach, 1/2 cup °· Frozen yogurt, 1/2 cup °· Tea or coffee  ° ° ° ° °LEVEL 2 = SOFT DIET ° °After your first 2 weeks, you can advance to a soft diet.   °Keep on this diet until everything goes down easily. ° °Hot Foods Cold Foods  °White fish Cottage cheese  °Stuffed fish Junior baby fruit  °Baby food meals Semi thickened juices  °Minced soft cooked, scrambled, poached eggs nectars  °Souffle & omelets Ripe mashed bananas  °Cooked cereals Canned fruit, pineapple sauce, milk  °potatoes Milkshake  °Buttered or Alfredo noodles Custard  °Cooked cooled vegetable Puddings, including tapioca  °Sherbet Yogurt  °Vegetable soup or alphabet soup Fruit ice, Italian ice  °Gravies Whipped gelatin  °Sugar, syrup, honey, jelly Junior baby desserts  °Sauces:  Cheese, creamed, barbecue, tomato, white Cream  °Coffee or tea Margarine  ° °SAMPLE MENU:  LEVEL 2 °Breakfast Lunch Dinner  °· Orange juice, 1/2 cup °· Oatmeal, 1/2 cup °· Scrambled eggs with cheese, 1/2 cup °· Decaffeinated tea, 1 cup °· Whole milk, 1 cup °· Non-dairy creamer, 2 Tbsp · Pineapple juice, 1/2 cup °· Minced beef, 3 oz °· Gravy, 2 Tbsp °· Mashed potatoes, 1/2 cup °· Minced fresh broccoli, 1/2 cup °· Applesauce, 1/2 cup °· Coffee, 1 cup · Turkey, barley soup, 3/4 cup °· Minced Hawaiian chicken, 3 oz °· Mashed potatoes, 1/2 cup °· Cooked spinach, 1/2 cup °· Frozen yogurt, 1/2 cup °· Non-dairy creamer, 2 Tbsp  ° ° ° ° °LEVEL 3 = CHOPPED DIET ° °-After all the foods in level 2 (soft diet) are passing through well you should advance up to more chopped foods.  °-It is still  important to cut these foods into small pieces and eat slowly. ° °Hot Foods Cold Foods  °Poultry Cottage cheese  °Chopped Swedish meatballs Yogurt  °Meat salads (ground or flaked meat) Milk  °Flaked fish (tuna) Milkshakes  °Poached or scrambled eggs Soft, cold, dry cereal  °Souffles and omelets Fruit juices or nectars  °Cooked cereals Chopped canned fruit  °Chopped French toast or pancakes Canned fruit cocktail  °Noodles or pasta (no rice) Pudding, mousse, custard  °Cooked vegetables (no frozen peas, corn, or mixed vegetables) Green salad  °Canned small sweet peas Ice cream  °Creamed soup or vegetable soup Fruit ice, Italian ice  °Pureed vegetable soup or alphabet soup Non-dairy creamer  °Ground scalloped   apples Margarine  °Gravies Mayonnaise  °Sauces:  Cheese, creamed, barbecue, tomato, white Ketchup  °Coffee or tea Mustard  ° °SAMPLE MENU:  LEVEL 3 °Breakfast Lunch Dinner  °· Orange juice, 1/2 cup °· Oatmeal, 1/2 cup °· Scrambled eggs with cheese, 1/2 cup °· Decaffeinated tea, 1 cup °· Whole milk, 1 cup °· Non-dairy creamer, 2 Tbsp °· Ketchup, 1 Tbsp °· Margarine, 1 tsp °· Salt, 1/4 tsp °· Sugar, 2 tsp · Pineapple juice, 1/2 cup °· Ground beef, 3 oz °· Gravy, 2 Tbsp °· Mashed potatoes, 1/2 cup °· Cooked spinach, 1/2 cup °· Applesauce, 1/2 cup °· Decaffeinated coffee °· Whole milk °· Non-dairy creamer, 2 Tbsp °· Margarine, 1 tsp °· Salt, 1/4 tsp · Pureed turkey, barley soup, 3/4 cup °· Barbecue chicken, 3 oz °· Mashed potatoes, 1/2 cup °· Ground fresh broccoli, 1/2 cup °· Frozen yogurt, 1/2 cup °· Decaffeinated tea, 1 cup °· Non-dairy creamer, 2 Tbsp °· Margarine, 1 tsp °· Salt, 1/4 tsp °· Sugar, 1 tsp  ° ° °LEVEL 4:  REGULAR FOODS ° °-Foods in this group are soft, moist, regularly textured foods.   °-This level includes meat and breads, which tend to be the hardest things to swallow.   °-Eat very slowly, chew well and continue to avoid carbonated drinks. °-most people are at this level in 4-6 weeks ° °Hot Foods  Cold Foods  °Baked fish or skinned Soft cheeses - cottage cheese  °Souffles and omelets Cream cheese  °Eggs Yogurt  °Stuffed shells Milk  °Spaghetti with meat sauce Milkshakes  °Cooked cereal Cold dry cereals (no nuts, dried fruit, coconut)  °French toast or pancakes Crackers  °Buttered toast Fruit juices or nectars  °Noodles or pasta (no rice) Canned fruit  °Potatoes (all types) Ripe bananas  °Soft, cooked vegetables (no corn, lima, or baked beans) Peeled, ripe, fresh fruit  °Creamed soups or vegetable soup Cakes (no nuts, dried fruit, coconut)  °Canned chicken noodle soup Plain doughnuts  °Gravies Ice cream  °Bacon dressing Pudding, mousse, custard  °Sauces:  Cheese, creamed, barbecue, tomato, white Fruit ice, Italian ice, sherbet  °Decaffeinated tea or coffee Whipped gelatin  °Pork chops Regular gelatin  ° Canned fruited gelatin molds  ° Sugar, syrup, honey, jam, jelly  ° Cream  ° Non-dairy  ° Margarine  ° Oil  ° Mayonnaise  ° Ketchup  ° Mustard  ° ° °If you have any questions please call our office at [facility]: [phone]. ° °

## 2013-08-25 NOTE — Progress Notes (Signed)
Patient ID: Steward RosJerry G Horine, male   DOB: 10/02/1935, 78 y.o.   MRN: [id number] Post op course Patient is a 78 year old male status post laparoscopic hernia repair with Nissen fundoplication. Patient has been doing well postoperatively. He states that his chest pain and dysphagia have resolved. Patient continues on a clear diet and has been doing well. He did state he had a peanut butter and jelly sandwich. He did say that was a little rough to go down. He also states that he's lost a little bit of weight.  On Exam: Wounds are clean dry and intact  Assessment and Plan 78 year old male status post Nissen fundoplication hiatal hernia repair. 1. We discussed again staying away from breads in leads at this point. 2. We transitioned to a level to diet as per discharge summary. 3. We'll have patient follow back up to 2-3 weeks.   Axel FillerArmando Kaidan Harpster, MD [facility] Trauma & Emergency Surgery

## 2013-09-16 ENCOUNTER — Ambulatory Visit (INDEPENDENT_AMBULATORY_CARE_PROVIDER_SITE_OTHER): Payer: Medicare Other | Admitting: General Surgery

## 2013-09-16 ENCOUNTER — Encounter (INDEPENDENT_AMBULATORY_CARE_PROVIDER_SITE_OTHER): Payer: Self-pay | Admitting: General Surgery

## 2013-09-16 VITALS — BP 128/62 | HR 65 | Temp 97.2°F | Resp 12 | Ht 67.5 in | Wt 177.6 lb

## 2013-09-16 DIAGNOSIS — Z9889 Other specified postprocedural states: Secondary | ICD-10-CM

## 2013-09-16 DIAGNOSIS — Z09 Encounter for follow-up examination after completed treatment for conditions other than malignant neoplasm: Secondary | ICD-10-CM

## 2013-09-16 NOTE — Patient Instructions (Signed)
EATING AFTER YOUR ESOPHAGEAL SURGERY °(Stomach Fundoplication, Hiatal Hernia repair, Achalasia surgery, etc) ° °After your esophageal surgery, expect some sticking with swallowing over the next 1-2 months.   ° °If food sticks when you eat, it is called "dysphagia".  This is due to swelling around your esophagus at the wrap & hiatal diaphragm repair.  It will gradually ease off over the next few months.  To help you through this temporary phase, we start you out on a pureed (blenderized) diet. ° °Your first meal in the hospital was thin liquids.  You should have been given a pureed diet by the time you left the hospital.  We ask patients to stay on a pureed diet for the first 2-3 weeks to avoid anything getting "stuck" near your recent surgery.  Don't be alarmed if your ability to swallow doesn't progress according to this plan.  Everyone is different and some diets can advance more or less quickly.   ° ° °Some BASIC RULES to follow are: °· Maintain an upright position whenever eating or drinking. °· Take small bites - just a teaspoon size bite at a time. °· Eat slowly.  It may also help to eat only one food at a time. °· Consider nibbling through smaller, more frequent meals & avoid the urge to eat BIG meals °· Do not push through feelings of fullness, nausea, or bloatedness °· Do not mix solid foods and liquids in the same mouthful °· Try not to "wash foods down" with large gulps of liquids. °Avoid carbonated (bubbly/fizzy) drinks.  Understand that it will be hard to burp and belch at first.  This gradually improves with time.  Expect to be more gassy/flatulent/bloated initially.  Walking will help you work through that.  Maalox/Gas-X can help as well. °· Eat in a relaxed atmosphere & minimize distractions. °· Avoid talking while eating.   °· Do not use straws. °· Following each meal, sit in an upright position (90 degree angle) for 60 to 90 minutes.  Going for a short walk can help as well °· If food does stick,  don't panic.  Try to relax and let the food pass on its own.  Sipping WARM LIQUID such as strong hot black tea can also help slide it down. ° ° °Be gradual in changes & use common sense: ° °-If you easily tolerating a certain "level" of foods, advance to the next level gradually °-If you are having trouble swallowing a particular food, then avoid it.   °-If food is sticking when you advance your diet, go back to thinner previous diet (the lower LEVEL) for 1-2 days. ° °LEVEL 1 = PUREED DIET ° °Do for the first 2 WEEKS AFTER SURGERY ° °-Foods in this group are pureed or blenderized to a smooth, mashed potato-like consistency.  °-If necessary, the pureed foods can keep their shape with the addition of a thickening agent.   °-Meat should be pureed to a smooth, pasty consistency.  Hot broth or gravy may be added to the pureed meat, approximately 1 oz. of liquid per 3 oz. serving of meat. °-CAUTION:  If any foods do not puree into a smooth consistency, swallowing will be more difficult.  (For example, nuts or seeds sometimes do not blend well.) ° °Hot Foods Cold Foods  °Pureed scrambled eggs and cheese Pureed cottage cheese  °Baby cereals Thickened juices and nectars  °Thinned cooked cereals (no lumps) Thickened milk or eggnog  °Pureed French toast or pancakes Ensure  °Mashed   potatoes Ice cream  °Pureed parsley, au gratin, scalloped potatoes, candied sweet potatoes Fruit or Italian ice, sherbet  °Pureed buttered or alfredo noodles Plain yogurt  °Pureed vegetables (no corn or peas) Instant breakfast  °Pureed soups and creamed soups Smooth pudding, mousse, custard  °Pureed scalloped apples Whipped gelatin  °Gravies Sugar, syrup, honey, jelly  °Sauces, cheese, tomato, barbecue, white, creamed Cream  °Any baby food Creamer  °Alcohol in moderation (not beer or champagne) Margarine  °Coffee or tea Mayonnaise  ° Ketchup, mustard  ° Apple sauce  ° °SAMPLE MENU:  PUREED DIET °Breakfast Lunch Dinner  °· Orange juice, 1/2  cup °· Cream of wheat, 1/2 cup · Pineapple juice, 1/2 cup · Pureed turkey, barley soup, 3/4 cup °· Pureed Hawaiian chicken, 3 oz  °· Scrambled eggs, mashed or blended with cheese, 1/2 cup °· Tea or coffee, 1 cup  °· Whole milk, 1 cup  °· Non-dairy creamer, 2 Tbsp. · Mashed potatoes, 1/2 cup °· Pureed cooled broccoli, 1/2 cup °· Apple sauce, 1/2 cup °· Coffee or tea · Mashed potatoes, 1/2 cup °· Pureed spinach, 1/2 cup °· Frozen yogurt, 1/2 cup °· Tea or coffee  ° ° ° ° °LEVEL 2 = SOFT DIET ° °After your first 2 weeks, you can advance to a soft diet.   °Keep on this diet until everything goes down easily. ° °Hot Foods Cold Foods  °White fish Cottage cheese  °Stuffed fish Junior baby fruit  °Baby food meals Semi thickened juices  °Minced soft cooked, scrambled, poached eggs nectars  °Souffle & omelets Ripe mashed bananas  °Cooked cereals Canned fruit, pineapple sauce, milk  °potatoes Milkshake  °Buttered or Alfredo noodles Custard  °Cooked cooled vegetable Puddings, including tapioca  °Sherbet Yogurt  °Vegetable soup or alphabet soup Fruit ice, Italian ice  °Gravies Whipped gelatin  °Sugar, syrup, honey, jelly Junior baby desserts  °Sauces:  Cheese, creamed, barbecue, tomato, white Cream  °Coffee or tea Margarine  ° °SAMPLE MENU:  LEVEL 2 °Breakfast Lunch Dinner  °· Orange juice, 1/2 cup °· Oatmeal, 1/2 cup °· Scrambled eggs with cheese, 1/2 cup °· Decaffeinated tea, 1 cup °· Whole milk, 1 cup °· Non-dairy creamer, 2 Tbsp · Pineapple juice, 1/2 cup °· Minced beef, 3 oz °· Gravy, 2 Tbsp °· Mashed potatoes, 1/2 cup °· Minced fresh broccoli, 1/2 cup °· Applesauce, 1/2 cup °· Coffee, 1 cup · Turkey, barley soup, 3/4 cup °· Minced Hawaiian chicken, 3 oz °· Mashed potatoes, 1/2 cup °· Cooked spinach, 1/2 cup °· Frozen yogurt, 1/2 cup °· Non-dairy creamer, 2 Tbsp  ° ° ° ° °LEVEL 3 = CHOPPED DIET ° °-After all the foods in level 2 (soft diet) are passing through well you should advance up to more chopped foods.  °-It is still  important to cut these foods into small pieces and eat slowly. ° °Hot Foods Cold Foods  °Poultry Cottage cheese  °Chopped Swedish meatballs Yogurt  °Meat salads (ground or flaked meat) Milk  °Flaked fish (tuna) Milkshakes  °Poached or scrambled eggs Soft, cold, dry cereal  °Souffles and omelets Fruit juices or nectars  °Cooked cereals Chopped canned fruit  °Chopped French toast or pancakes Canned fruit cocktail  °Noodles or pasta (no rice) Pudding, mousse, custard  °Cooked vegetables (no frozen peas, corn, or mixed vegetables) Green salad  °Canned small sweet peas Ice cream  °Creamed soup or vegetable soup Fruit ice, Italian ice  °Pureed vegetable soup or alphabet soup Non-dairy creamer  °Ground scalloped   apples Margarine  °Gravies Mayonnaise  °Sauces:  Cheese, creamed, barbecue, tomato, white Ketchup  °Coffee or tea Mustard  ° °SAMPLE MENU:  LEVEL 3 °Breakfast Lunch Dinner  °· Orange juice, 1/2 cup °· Oatmeal, 1/2 cup °· Scrambled eggs with cheese, 1/2 cup °· Decaffeinated tea, 1 cup °· Whole milk, 1 cup °· Non-dairy creamer, 2 Tbsp °· Ketchup, 1 Tbsp °· Margarine, 1 tsp °· Salt, 1/4 tsp °· Sugar, 2 tsp · Pineapple juice, 1/2 cup °· Ground beef, 3 oz °· Gravy, 2 Tbsp °· Mashed potatoes, 1/2 cup °· Cooked spinach, 1/2 cup °· Applesauce, 1/2 cup °· Decaffeinated coffee °· Whole milk °· Non-dairy creamer, 2 Tbsp °· Margarine, 1 tsp °· Salt, 1/4 tsp · Pureed turkey, barley soup, 3/4 cup °· Barbecue chicken, 3 oz °· Mashed potatoes, 1/2 cup °· Ground fresh broccoli, 1/2 cup °· Frozen yogurt, 1/2 cup °· Decaffeinated tea, 1 cup °· Non-dairy creamer, 2 Tbsp °· Margarine, 1 tsp °· Salt, 1/4 tsp °· Sugar, 1 tsp  ° ° °LEVEL 4:  REGULAR FOODS ° °-Foods in this group are soft, moist, regularly textured foods.   °-This level includes meat and breads, which tend to be the hardest things to swallow.   °-Eat very slowly, chew well and continue to avoid carbonated drinks. °-most people are at this level in 4-6 weeks ° °Hot Foods  Cold Foods  °Baked fish or skinned Soft cheeses - cottage cheese  °Souffles and omelets Cream cheese  °Eggs Yogurt  °Stuffed shells Milk  °Spaghetti with meat sauce Milkshakes  °Cooked cereal Cold dry cereals (no nuts, dried fruit, coconut)  °French toast or pancakes Crackers  °Buttered toast Fruit juices or nectars  °Noodles or pasta (no rice) Canned fruit  °Potatoes (all types) Ripe bananas  °Soft, cooked vegetables (no corn, lima, or baked beans) Peeled, ripe, fresh fruit  °Creamed soups or vegetable soup Cakes (no nuts, dried fruit, coconut)  °Canned chicken noodle soup Plain doughnuts  °Gravies Ice cream  °Bacon dressing Pudding, mousse, custard  °Sauces:  Cheese, creamed, barbecue, tomato, white Fruit ice, Italian ice, sherbet  °Decaffeinated tea or coffee Whipped gelatin  °Pork chops Regular gelatin  ° Canned fruited gelatin molds  ° Sugar, syrup, honey, jam, jelly  ° Cream  ° Non-dairy  ° Margarine  ° Oil  ° Mayonnaise  ° Ketchup  ° Mustard  ° ° °If you have any questions please call our office at [facility]: [phone]. ° °

## 2013-09-16 NOTE — Progress Notes (Signed)
Patient ID: Troy Parker, male   DOB: 06/23/1935, 78 y.o.   MRN: [id number] Post op course Patient is a 78 year old male status post laparoscopic Nissen fundoplication.  Patient is continuing to stage III diet at this time. He does state that he had a piece of bread and was difficult to swallow. He does state he has some epigastric pain that is relieved with belching.  On Exam: Wounds are clean dry and  Assessment and Plan 78 year old male status post laparoscopic Nissen fundoplication 1. I instructed the patient to stay on his stage III diet for another week. At that time thereafter he can continue with a normal regular diet. I advised him to slowly work with bread and meat like steak by taking small meals and chewing food finely.  2. The patient will call us back for any issues in for a followup appointment as needed. Otherwise he can follow up as needed   Axel FillerArmando Odelia Graciano, MD [facility] General & [facility]imally Invasive Surgery Trauma & Emergency Surgery

## 2015-01-31 IMAGING — CR DG CHEST 2V
2 series · 2 of 2 positions shown · non-contrast
Comparison: None.

CLINICAL DATA: Two-day history of chest pain, prior history of
tobacco use

EXAM:
CHEST  2 VIEW

[w chest lat]
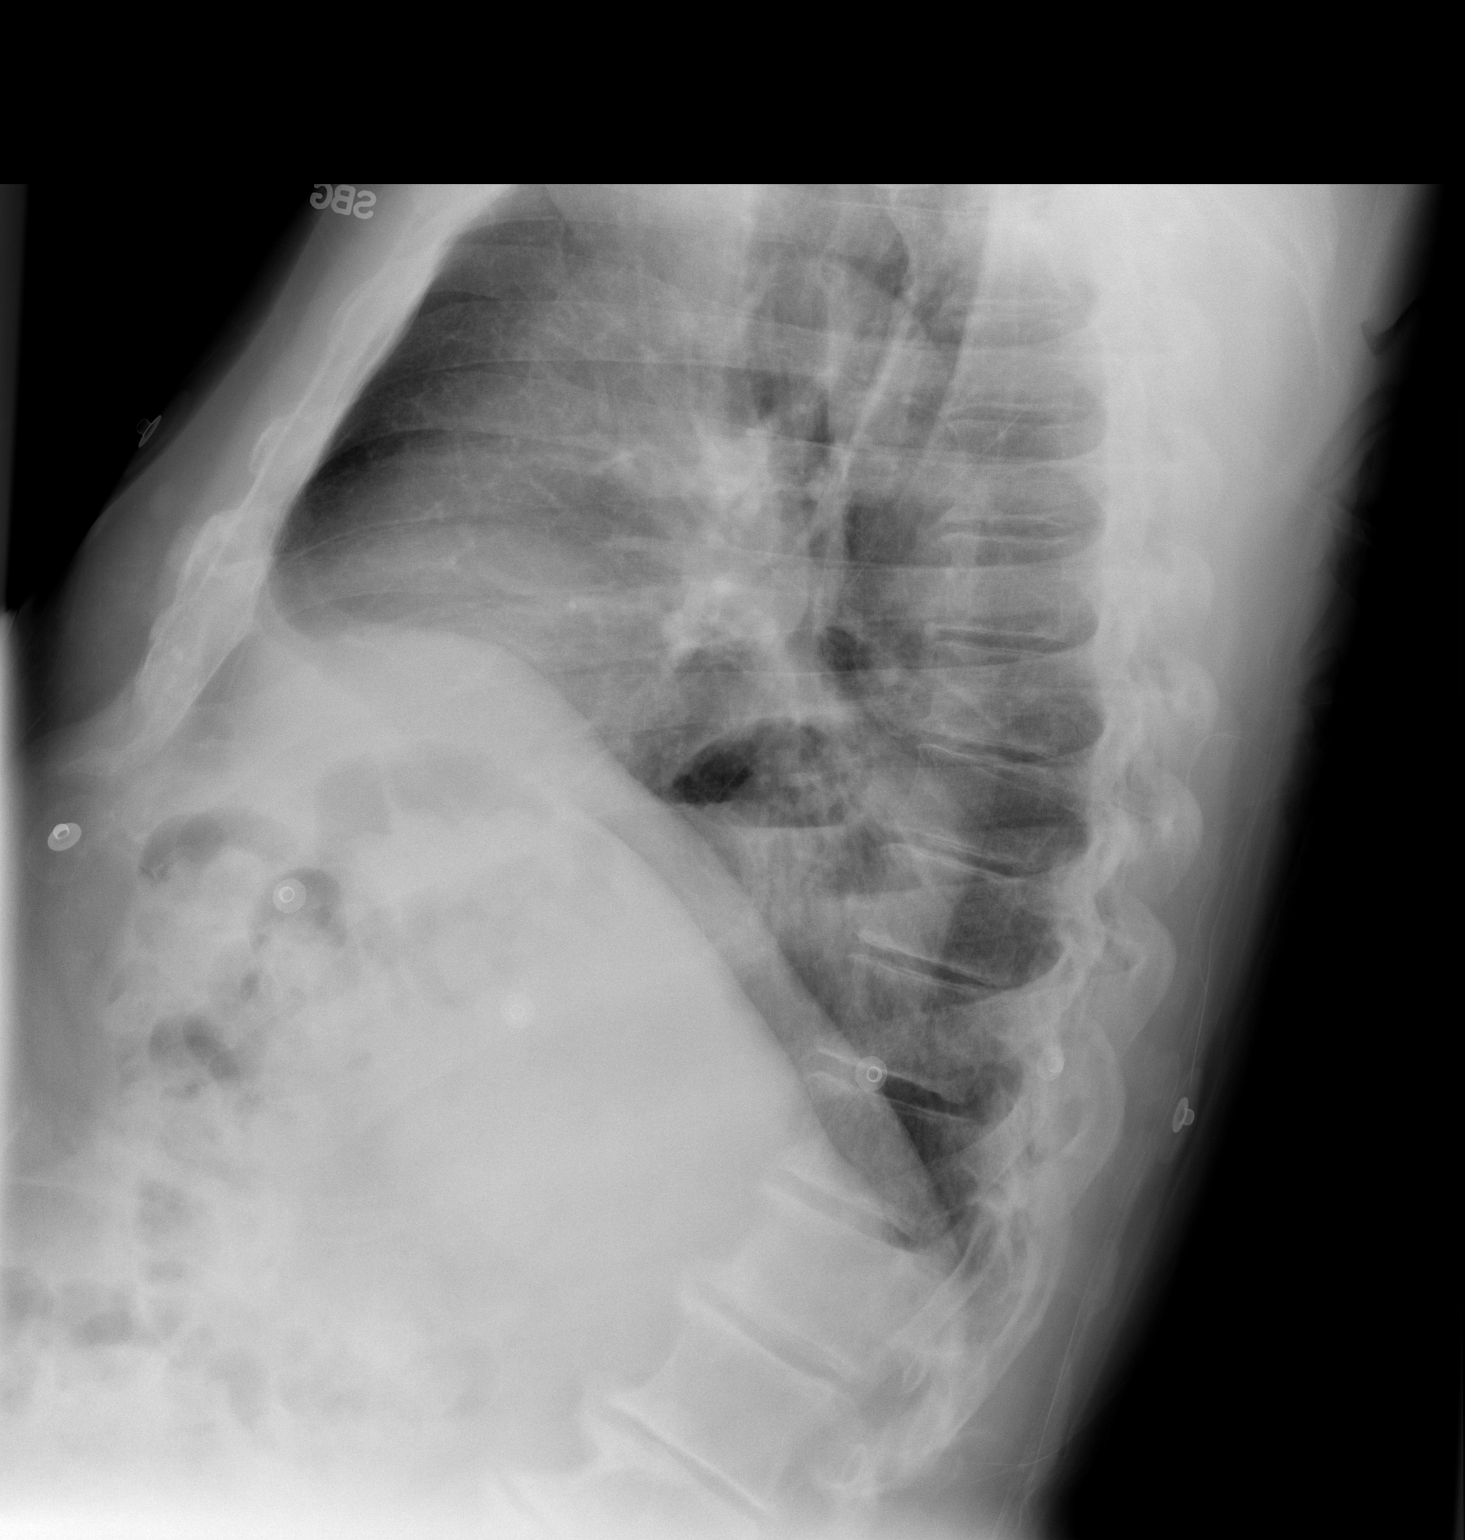

[w chest pa]
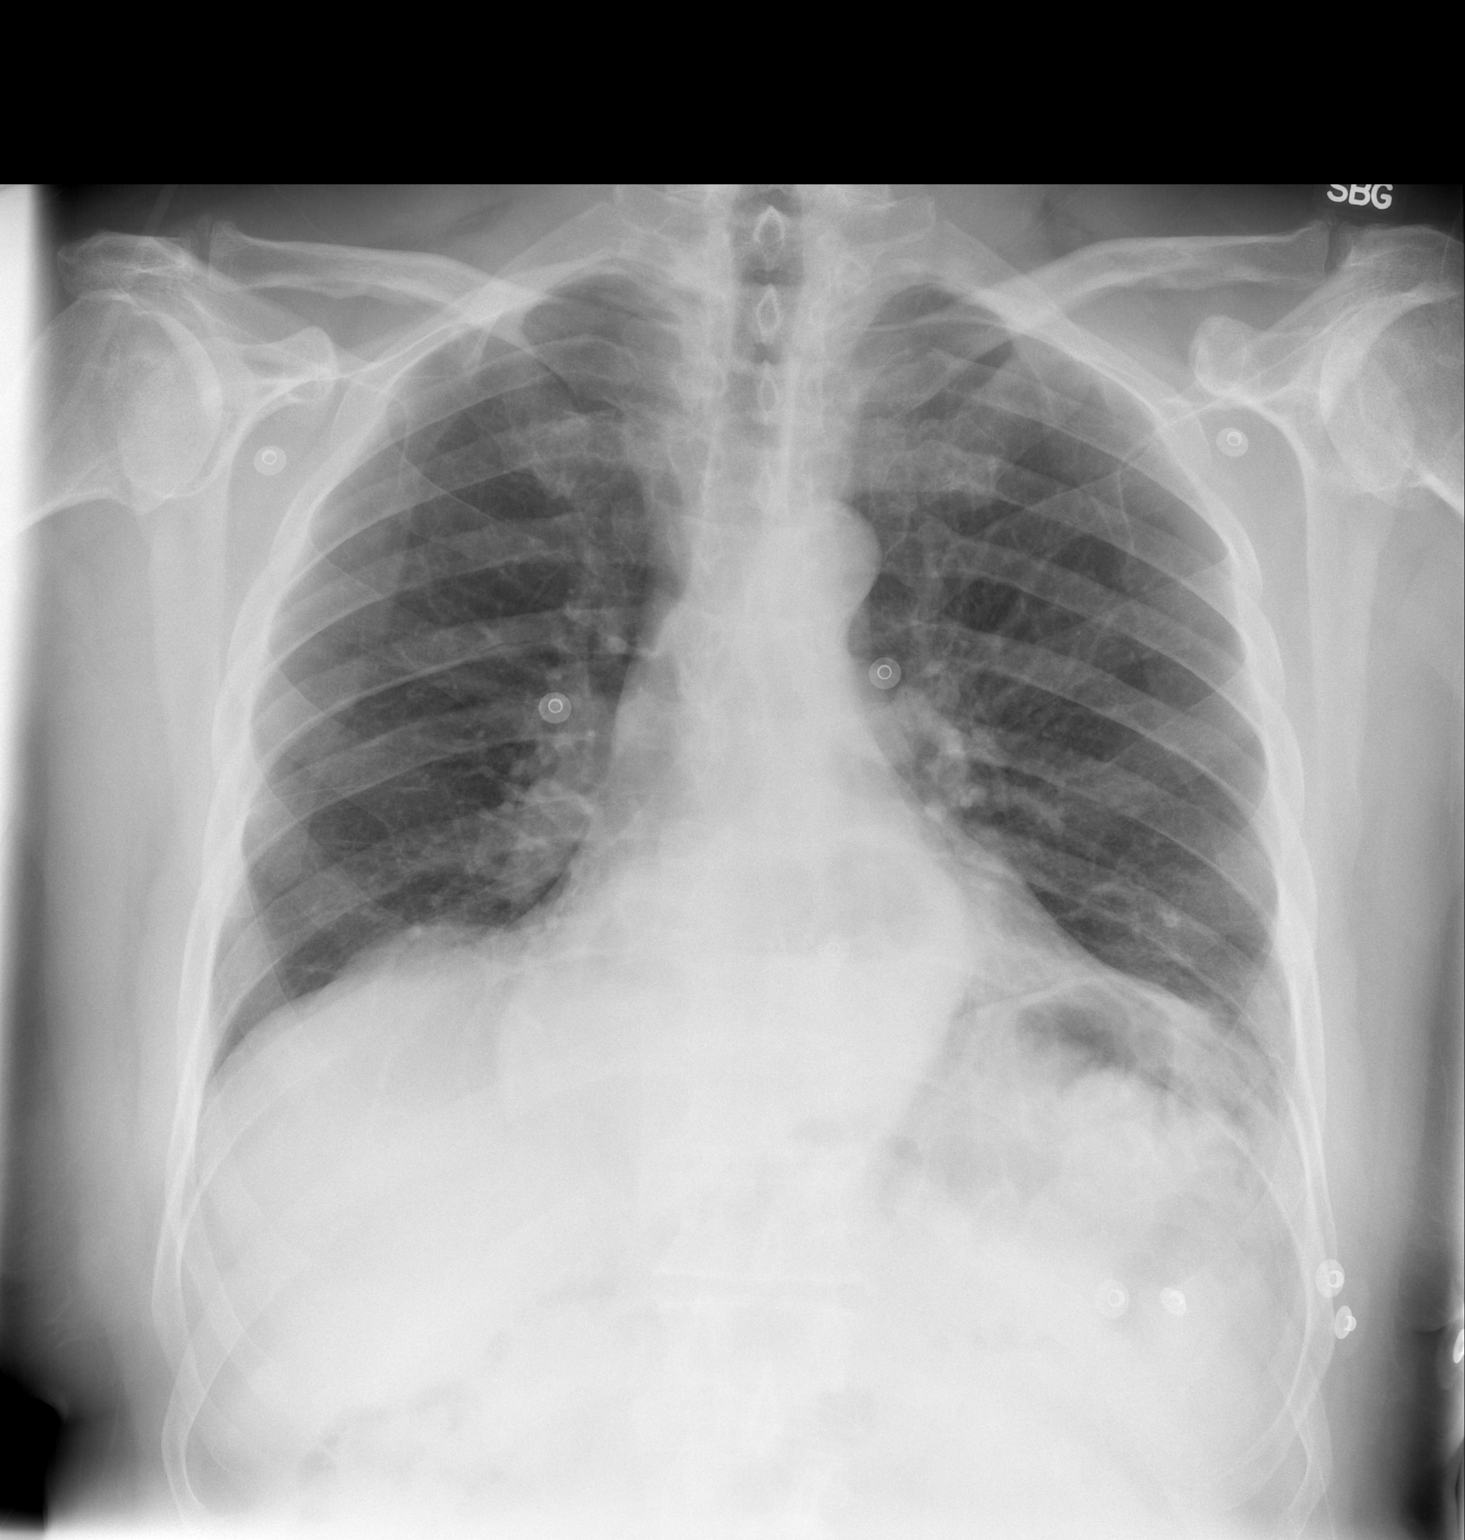

[2 of 2 positions shown; findings below may reference images not displayed]

FINDINGS: The lungs are hypo inflated. There are air fluid levels visible on
the lateral film which on the frontal film overlie the lower
mediastinum entered consistent with a partially intrathoracic
stomach-hiatal hernia. There is nos evidence of an acute infiltrate.
There may be minimal subsegmental atelectasis at the left lung base.
There is no pleural effusion. The mediastinum is normal in width.
The cardiopericardial silhouette is normal in size. The pulmonary
vascularity is not engorged. There is mild tortuosity of the
descending thoracic aorta.
IMPRESSION: 1. There is a large hiatal hernia-partially intrathoracic stomach.
2. There is bilateral pulmonary hypo inflation. There may be minimal
subsegmental atelectasis at the left lung base. There is no evidence
of pneumonia.
3. There is no evidence of CHF nor pleural effusions.

## 2015-11-12 ENCOUNTER — Emergency Department (HOSPITAL_COMMUNITY)
Admission: EM | Admit: 2015-11-12 | Discharge: 2015-11-12 | Disposition: A | Discharge status: Home / Self Care | Payer: Medicare Other | Attending: Emergency Medicine | Admitting: Emergency Medicine

## 2015-11-12 ENCOUNTER — Encounter (HOSPITAL_COMMUNITY): Payer: Self-pay | Admitting: *Deleted

## 2015-11-12 DIAGNOSIS — Z87891 Personal history of nicotine dependence: Secondary | ICD-10-CM | POA: Insufficient documentation

## 2015-11-12 DIAGNOSIS — K4090 Unilateral inguinal hernia, without obstruction or gangrene, not specified as recurrent: Secondary | ICD-10-CM | POA: Insufficient documentation

## 2015-11-12 DIAGNOSIS — Z79899 Other long term (current) drug therapy: Secondary | ICD-10-CM | POA: Insufficient documentation

## 2015-11-12 LAB — I-STAT CHEM 8, ED
BUN: 20 mg/dL (ref 6–20)
Calcium, Ion: 1.16 mmol/L (ref 1.12–1.23)
Chloride: 101 mmol/L (ref 101–111)
Creatinine, Ser: 1.2 mg/dL (ref 0.61–1.24)
Glucose, Bld: 107 mg/dL — ABNORMAL HIGH (ref 65–99)
HEMATOCRIT: 44 % (ref 39.0–52.0)
Hemoglobin: 15 g/dL (ref 13.0–17.0)
POTASSIUM: 4.4 mmol/L (ref 3.5–5.1)
Sodium: 141 mmol/L (ref 135–145)
TCO2: 26 mmol/L (ref 0–100)

## 2015-11-12 LAB — CBC
HEMATOCRIT: 44 % (ref 39.0–52.0)
Hemoglobin: 14.8 g/dL (ref 13.0–17.0)
MCH: 30.8 pg (ref 26.0–34.0)
MCHC: 33.6 g/dL (ref 30.0–36.0)
MCV: 91.7 fL (ref 78.0–100.0)
Platelets: 154 10*3/uL (ref 150–400)
RBC: 4.8 MIL/uL (ref 4.22–5.81)
RDW: 12.7 % (ref 11.5–15.5)
WBC: 7.2 10*3/uL (ref 4.0–10.5)

## 2015-11-12 LAB — I-STAT CG4 LACTIC ACID, ED: Lactic Acid, Venous: 0.85 mmol/L (ref 0.5–1.9)

## 2015-11-12 MED ORDER — HYDROMORPHONE HCL 1 MG/ML IJ SOLN
1.0000 mg | Freq: Once | INTRAMUSCULAR | Status: AC
  Administered 2015-11-12: 1 mg via INTRAVENOUS
  Filled 2015-11-12: qty 1

## 2015-11-12 NOTE — ED Notes (Signed)
Pt was sent from MD office to ED with left groin hernia.  Pt states that he was told that it was stuck and need to come here.  Pt reports some pain to left groin.

## 2015-11-12 NOTE — ED Notes (Signed)
Patient verbalized understanding of discharge instructions and denies any further needs or questions at this time. VS stable. Patient ambulatory with steady gait.  

## 2015-11-12 NOTE — ED Notes (Signed)
Patient ambulatory to restroom with steady gait, no signs of distress noted.

## 2015-11-12 NOTE — ED Notes (Signed)
MD at bedside. 

## 2015-11-12 NOTE — Discharge Instructions (Signed)

## 2015-11-12 NOTE — ED Provider Notes (Signed)
CSN: [id number]     Arrival date & time 11/12/15  1300 History   First MD Initiated Contact with Patient 11/12/15 1824     Chief Complaint  Patient presents with  . Inguinal Hernia     (Consider location/radiation/quality/duration/timing/severity/associated sxs/prior Treatment) Patient is a 80 y.o. male presenting with groin pain. The history is provided by the patient.  Groin Pain This is a new problem. The current episode started in the past 7 days. The problem occurs constantly. The problem has been unchanged. Associated symptoms include abdominal pain. Pertinent negatives include no chest pain, chills, congestion, coughing, fatigue, fever, nausea, neck pain, numbness, rash, sore throat, urinary symptoms or vomiting. Nothing aggravates the symptoms. He has tried nothing for the symptoms. The treatment provided no relief.    Past Medical History  Diagnosis Date  . Glaucoma   . Hypercholesterolemia   . BPH (benign prostatic hyperplasia)   . Nephrolithiasis     a. remote surgical removal.  . Chest pain     a. h/o nl cath in his 57's - [place].  Marland Kitchen Resting tremor     a. first noticed 03/2013 - left hand and chin/mouth.  . Paraesophageal hiatal hernia     a. noted on cxr 04/2013. b. confirmed on UGI series 04/2013, to f/u with surgery as an OP  . Anxiety   . Refusal of blood transfusions as patient is Jehov[employer]'s Witness 08-04-13    consent signed with chart  . Cataract 08-04-13    left eye-future surgery planned  . Rosacea     mild face   Past Surgical History  Procedure Laterality Date  . Eye surgery  04/11/13    cataract removal R eye  . Kidney stone surgery    . Foot surgery      x2- bunionectomy, right foot surgery right great toe fused  . Back surgery      lower back  . Laparoscopic nissen fundoplication N/A 08/09/2013    Procedure: LAPAROSCOPIC NISSEN FUNDOPLICATION AND LAPAROSCOPIC HIATAL HERNIA REPAIR ;  Surgeon: Axel Filler, MD;  Location: [facility];  Service: General;   Laterality: N/A;  . Insertion of mesh N/A 08/09/2013    Procedure: INSERTION OF MESH;  Surgeon: Axel Filler, MD;  Location: [facility];  Service: General;  Laterality: N/A;   Family History  Problem Relation Age of Onset  . CAD Mother     h/o cabg and redo cabg - died in her 1's  . CAD Father     h/o cabg - died in his 61's  . CAD Brother     s/p cabg  . CAD Brother     s/p cabg  . Stroke Brother   . Other Brother     pts twin -    Social History  Substance Use Topics  . Smoking status: Former Smoker    Quit date: 08/04/1968  . Smokeless tobacco: Never Used  . Alcohol Use: Yes     Comment: occ. to rare    Review of Systems  Constitutional: Negative for fever, chills and fatigue.  HENT: Negative for congestion and sore throat.   Eyes: Negative for pain.  Respiratory: Negative for cough and shortness of breath.   Cardiovascular: Negative for chest pain and palpitations.  Gastrointestinal: Positive for abdominal pain. Negative for nausea, vomiting and diarrhea.  Endocrine: Negative.   Genitourinary: Negative for dysuria, flank pain and penile pain.       Left groin pain and swelling  Musculoskeletal: Negative for  back pain and neck pain.  Skin: Negative for rash.  Allergic/Immunologic: Negative.   Neurological: Negative for dizziness, syncope, light-headedness and numbness.  Psychiatric/Behavioral: Negative for confusion.      Allergies  Review of patient's allergies indicates no known allergies.  Home Medications   Prior to Admission medications   Medication Sig Start Date End Date Taking? Authorizing Provider  escitalopram (LEXAPRO) 20 MG tablet Take 20 mg by mouth at bedtime.    Yes Historical Provider, MD  rosuvastatin (CRESTOR) 20 MG tablet Take 20 mg by mouth at bedtime.   Yes Historical Provider, MD  terazosin (HYTRIN) 10 MG capsule Take 10 mg by mouth at bedtime.   Yes Historical Provider, MD  travoprost, benzalkonium, (TRAVATAN) 0.004 % ophthalmic solution  Place 1 drop into both eyes at bedtime.   Yes Historical Provider, MD   BP 141/67 mmHg  Pulse 66  Temp(Src) 98.7 F (37.1 C) (Oral)  Resp 18  SpO2 96% Physical Exam  Constitutional: He is oriented to person, place, and time. He appears well-developed and well-nourished.  HENT:  Head: Normocephalic and atraumatic.  Eyes: Conjunctivae and EOM are normal. Pupils are equal, round, and reactive to light.  Neck: Normal range of motion. Neck supple.  Cardiovascular: Normal rate, regular rhythm, normal heart sounds and intact distal pulses.   Pulmonary/Chest: Effort normal and breath sounds normal. No respiratory distress.  Abdominal: Soft. Bowel sounds are normal. There is no tenderness. A hernia is present. Hernia confirmed positive in the left inguinal area (swollen with moderate tenderness but no overlying erythema.). Hernia confirmed negative in the right inguinal area.  Musculoskeletal: Normal range of motion.  Lymphadenopathy:       Left: No inguinal adenopathy present.  Neurological: He is alert and oriented to person, place, and time. He has normal reflexes. No cranial nerve deficit.  Skin: Skin is warm and dry.    ED Course  Hernia reduction Date/Time: 11/13/2015 12:07 AM Performed by: Tery Sanfilippo Authorized by: Marily Memos Consent: Verbal consent obtained. Consent given by: patient Local anesthesia used: no Patient sedated: no Patient tolerance: Patient tolerated the procedure well with no immediate complications Comments: Unable to manually reduce hernia initially.    (including critical care time) Labs Review Labs Reviewed  I-STAT CHEM 8, ED - Abnormal; Notable for the following:    Glucose, Bld 107 (*)    All other components within normal limits  CBC  I-STAT CG4 LACTIC ACID, ED    Imaging Review No results found. I have personally reviewed and evaluated these images and lab results as part of my medical decision-making.   EKG Interpretation None       MDM   Final diagnoses:  Left groin hernia    The patient is a 80 year old male presenting to the emergency department for evaluation of a left-sided hernia from his PCP. He reports that over the last 2 days his chronic left-sided hernia has enlarged and not spontaneously reduced as it has in the past. He saw his PCP for this who sent him to the emergency department for further evaluation.  On initial evaluation patient is imminently stable and in no acute distress. Low suspicion for strangulated hernia at this time. CBC lactic acid and BMP within normal limits. Ice placed on affected area patient given pain medication and placed in Trendelenburg position. No constipation or lack of bowel movements or abdominal tenderness or swelling to indicate obstruction.  Attempted reduction without alleviation.  Increased trendelenburg and on reevaluation hernia spontaneously  reduced with pt reporting reduced pain.  Walked easily in ED without repeat herniation.  Pt to f/u with surgery for possible surgical fixation.   Labs were viewed by myself  incorporated into medical decision making.  Discussed pertinent finding with patient or caregiver prior to discharge with no further questions.  Immediate return precautions given and understood.  Medical decision making supervised by my attending Dr. Clayborne DanaMesner.   Tery SanfilippoMatthew Amariyah Bazar, MD PGY-3 Emergency Medicine     Tery SanfilippoMatthew Anaise Sterbenz, MD 11/13/15 [id number]  Marily MemosJason Mesner, MD 11/14/15 [phone]
